# Patient Record
Sex: Female | Born: 1970 | Race: White | Hispanic: No | Smoking: Never smoker
Health system: Southern US, Community
[De-identification: ages and names within clinical notes are randomized; demographics above are authoritative.]

## PROBLEM LIST (undated history)

## (undated) DIAGNOSIS — N390 Urinary tract infection, site not specified: Secondary | ICD-10-CM

## (undated) DIAGNOSIS — L709 Acne, unspecified: Secondary | ICD-10-CM

## (undated) HISTORY — PX: WISDOM TOOTH EXTRACTION: SHX21

## (undated) HISTORY — DX: Urinary tract infection, site not specified: N39.0

## (undated) HISTORY — DX: Acne, unspecified: L70.9

---

## 1998-05-20 ENCOUNTER — Other Ambulatory Visit: Admission: RE | Admit: 1998-05-20 | Discharge: 1998-05-20 | Payer: Self-pay

## 2000-11-30 DIAGNOSIS — Z789 Other specified health status: Secondary | ICD-10-CM

## 2000-11-30 HISTORY — DX: Other specified health status: Z78.9

## 2003-03-12 ENCOUNTER — Encounter: Payer: Self-pay | Admitting: Obstetrics

## 2003-03-12 ENCOUNTER — Ambulatory Visit (HOSPITAL_COMMUNITY): Admission: RE | Admit: 2003-03-12 | Discharge: 2003-03-12 | Payer: Self-pay | Admitting: Obstetrics

## 2003-03-14 ENCOUNTER — Inpatient Hospital Stay (HOSPITAL_COMMUNITY): Admission: AD | Admit: 2003-03-14 | Discharge: 2003-03-14 | Payer: Self-pay | Admitting: Obstetrics

## 2003-03-15 ENCOUNTER — Encounter (INDEPENDENT_AMBULATORY_CARE_PROVIDER_SITE_OTHER): Payer: Self-pay

## 2003-03-17 ENCOUNTER — Inpatient Hospital Stay (HOSPITAL_COMMUNITY): Admission: AD | Admit: 2003-03-17 | Discharge: 2003-03-17 | Payer: Self-pay | Admitting: Obstetrics

## 2003-05-15 ENCOUNTER — Ambulatory Visit (HOSPITAL_COMMUNITY): Admission: RE | Admit: 2003-05-15 | Discharge: 2003-05-15 | Payer: Self-pay | Admitting: Obstetrics & Gynecology

## 2003-05-15 ENCOUNTER — Encounter: Payer: Self-pay | Admitting: Obstetrics & Gynecology

## 2003-05-30 ENCOUNTER — Ambulatory Visit (HOSPITAL_COMMUNITY): Admission: RE | Admit: 2003-05-30 | Discharge: 2003-05-30 | Payer: Self-pay | Admitting: Obstetrics & Gynecology

## 2003-05-30 ENCOUNTER — Encounter: Payer: Self-pay | Admitting: Obstetrics & Gynecology

## 2003-06-10 ENCOUNTER — Inpatient Hospital Stay (HOSPITAL_COMMUNITY): Admission: AD | Admit: 2003-06-10 | Discharge: 2003-06-10 | Payer: Self-pay | Admitting: Obstetrics

## 2003-07-28 ENCOUNTER — Inpatient Hospital Stay (HOSPITAL_COMMUNITY): Admission: AD | Admit: 2003-07-28 | Discharge: 2003-07-28 | Payer: Self-pay | Admitting: Obstetrics

## 2003-07-28 ENCOUNTER — Encounter: Payer: Self-pay | Admitting: Obstetrics

## 2003-08-24 ENCOUNTER — Ambulatory Visit (HOSPITAL_COMMUNITY): Admission: RE | Admit: 2003-08-24 | Discharge: 2003-08-24 | Payer: Self-pay | Admitting: Obstetrics

## 2003-08-24 ENCOUNTER — Encounter: Payer: Self-pay | Admitting: Obstetrics

## 2003-09-23 ENCOUNTER — Inpatient Hospital Stay (HOSPITAL_COMMUNITY): Admission: AD | Admit: 2003-09-23 | Discharge: 2003-09-23 | Payer: Self-pay | Admitting: Obstetrics

## 2003-10-02 ENCOUNTER — Inpatient Hospital Stay (HOSPITAL_COMMUNITY): Admission: AD | Admit: 2003-10-02 | Discharge: 2003-11-21 | Payer: Self-pay | Admitting: Obstetrics

## 2003-12-28 ENCOUNTER — Inpatient Hospital Stay (HOSPITAL_COMMUNITY): Admission: RE | Admit: 2003-12-28 | Discharge: 2003-12-31 | Payer: Self-pay | Admitting: Obstetrics

## 2005-05-04 ENCOUNTER — Ambulatory Visit (HOSPITAL_COMMUNITY): Admission: RE | Admit: 2005-05-04 | Discharge: 2005-05-04 | Payer: Self-pay | Admitting: Obstetrics

## 2005-09-08 IMAGING — US US OB FOLLOW-UP
1 series · 18 of 28 positions shown · non-contrast
Comparison: none

CLINICAL DATA: Assess growth, amniotic fluid volume and cervical length.  G2 P0 SAB1.  LMP 04/10/03.  Preterm labor.

[Series 1: us ob re-eval · 18 of 29 slices shown]
[im 1/29]
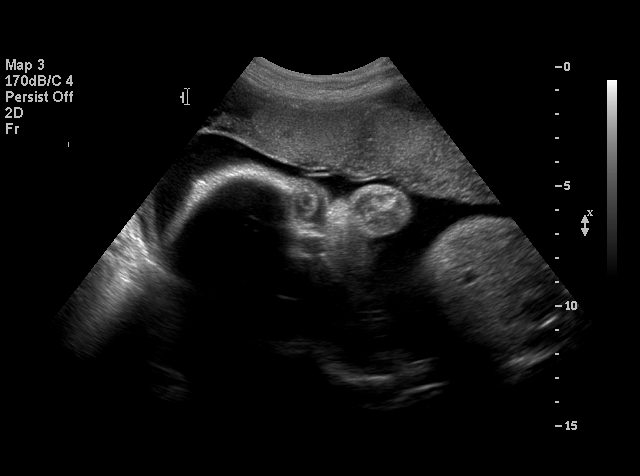
[im 3/29]
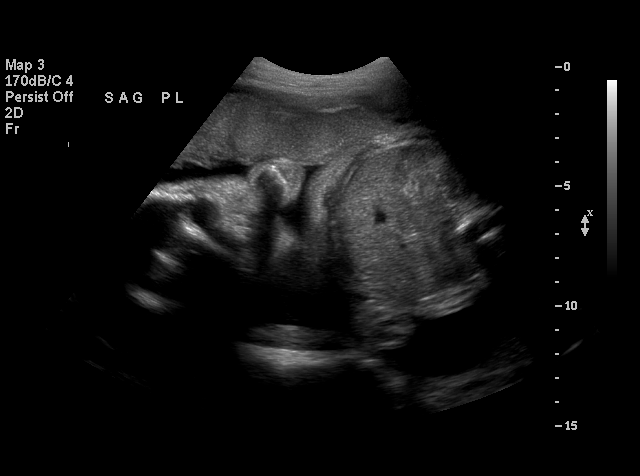
[im 4/29]
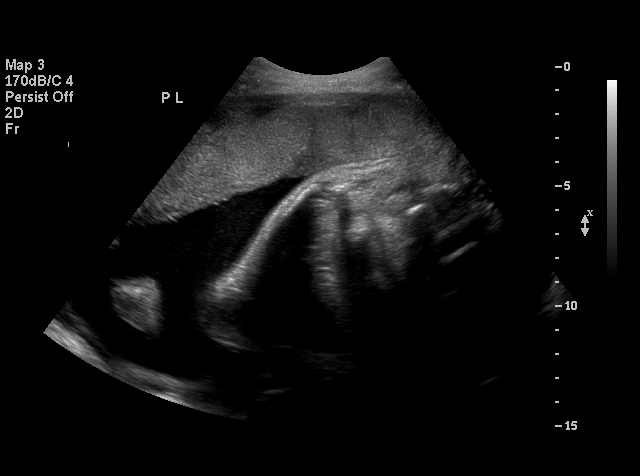
[im 6/29]
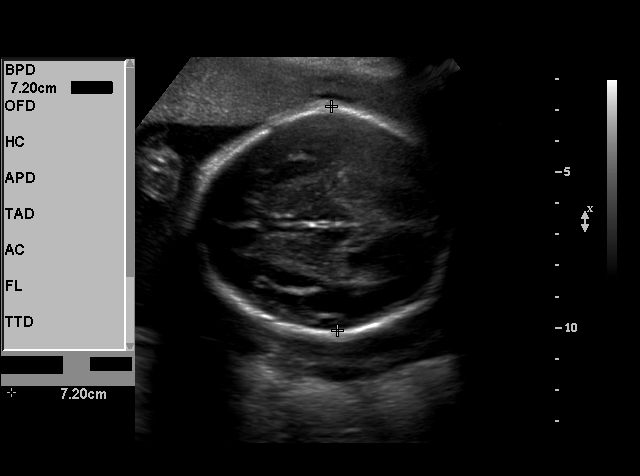
[im 8/29]
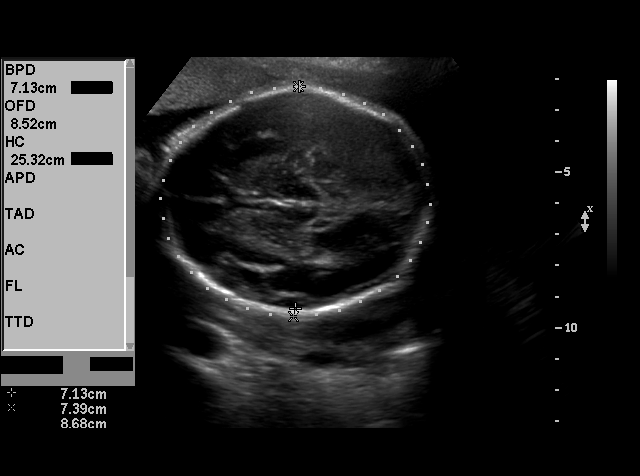
[im 9/29]
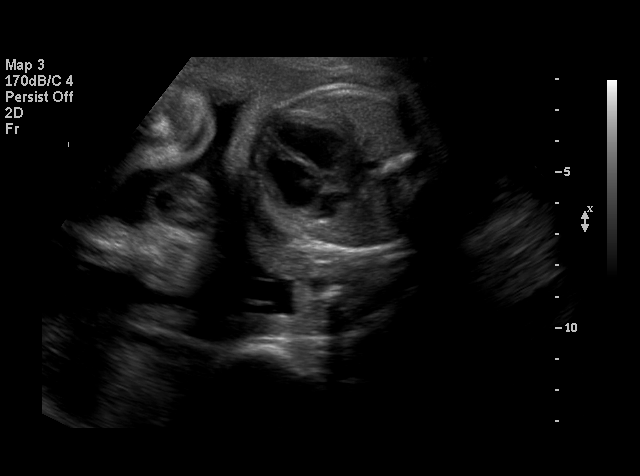
[im 11/29]
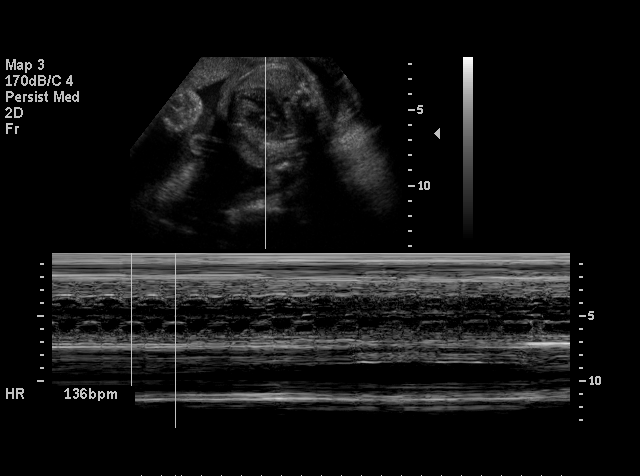
[im 12/29]
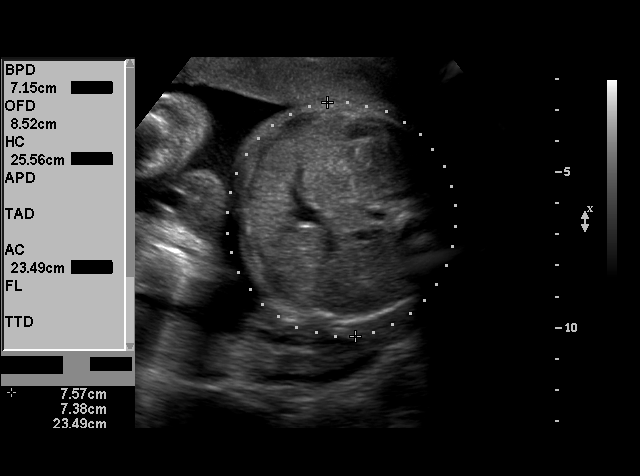
[im 14/29]
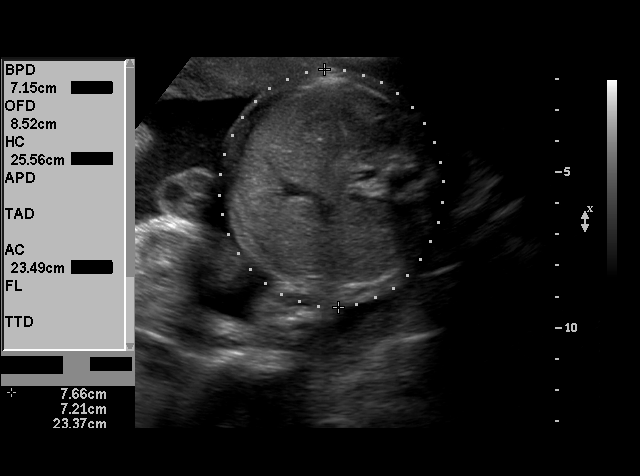
[im 15/29]
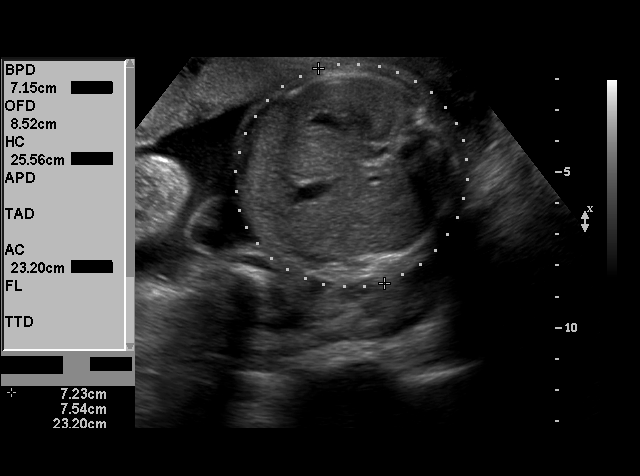
[im 17/29]
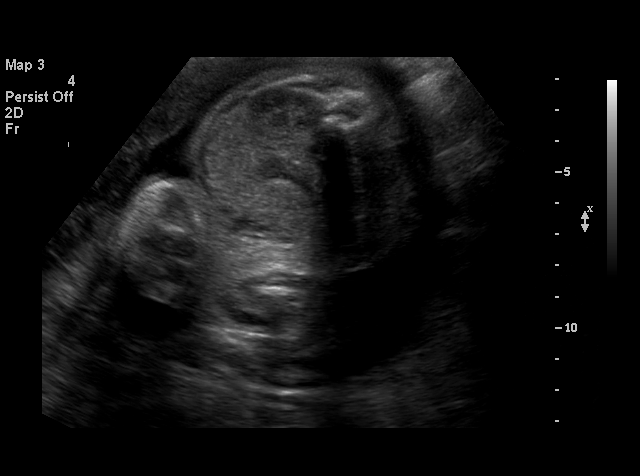
[im 18/29]
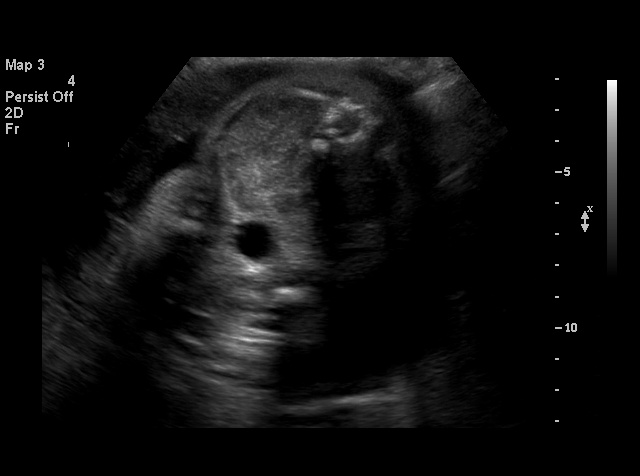
[im 20/29]
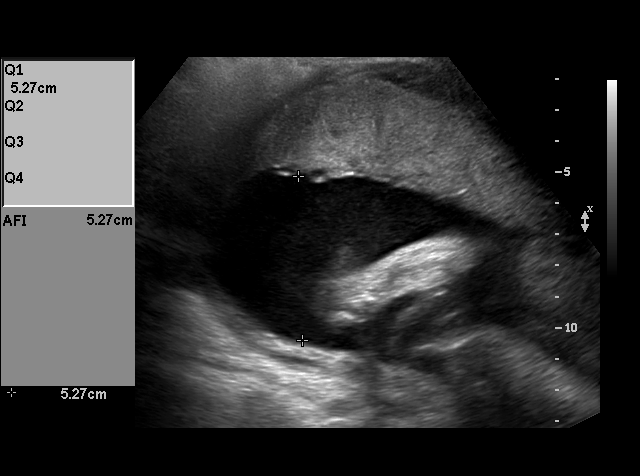
[im 22/29]
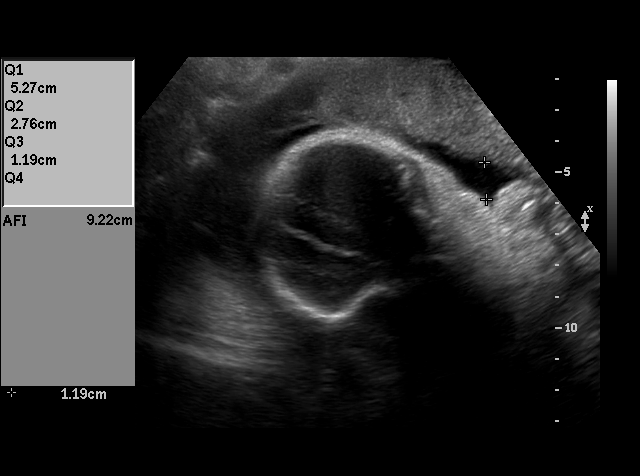
[im 23/29]
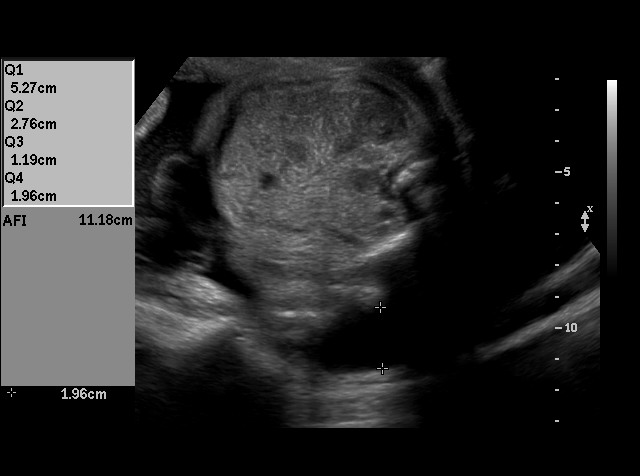
[im 25/29]
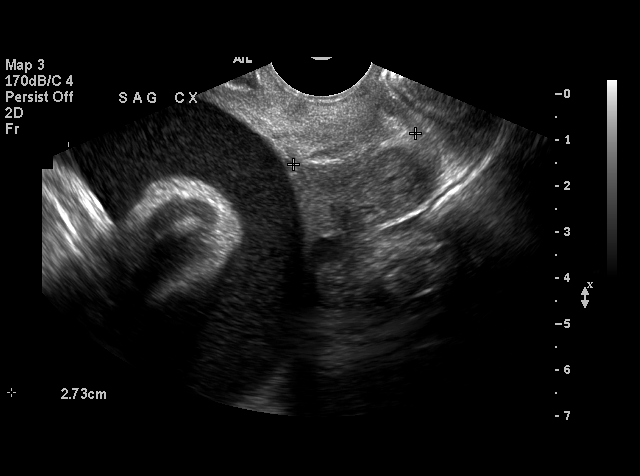
[im 26/29]
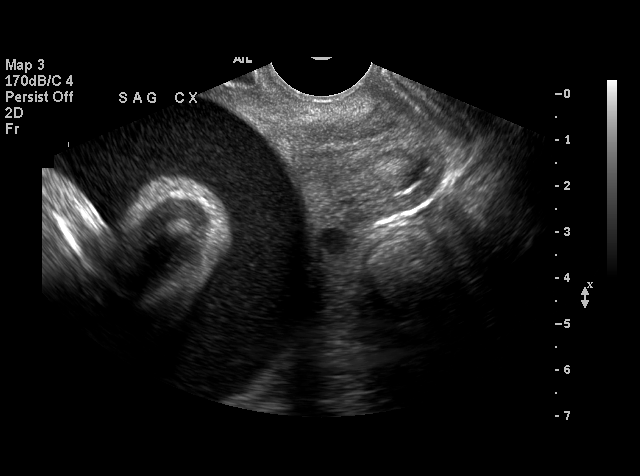
[im 29/29]
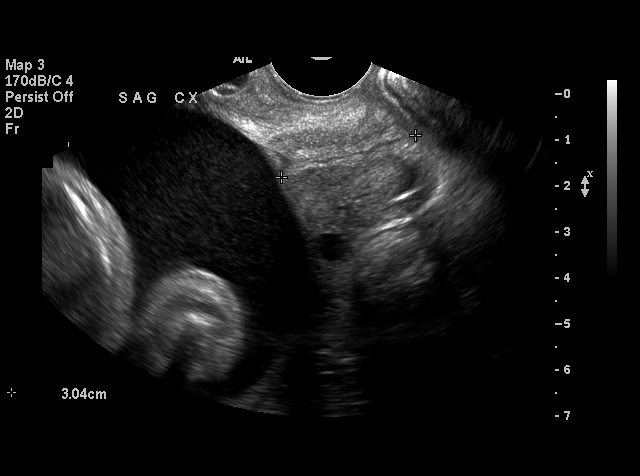

[18 of 28 positions shown; findings below may reference images not displayed]

OBSTETRICAL ULTRASOUND RE-EVALUATION WITH TRANSVAGINAL:

 NUMBER OF FETUSES:  1
 HEART RATE:  136
 MOVEMENT:  Yes
 BREATHING:  No
 PRESENTATION:  Breech
 PLACENTAL LOCATION:  Anterior
 GRADE:  I
 PREVIA:  No
 AMNIOTIC FLUID (subjective):  Normal
 AMNIOTIC FLUID (objective):  11.2 cm AFI (5th – 95th %ile = 9.5 – 22.6 cm for 27 weeks)

 FETAL BIOMETRY
 BPD:  7.2 cm  28 w 6 d
 HC:  25.4 cm  27 w 5 d
 AC:  23.4 cm  27 w 5 d
 FL:  4.7 cm  25 w 4 d
 MEAN GA:  27 w 4 d
 ASSIGNED GA:  27 w 1 d (LMP)
 EFW:  7886 g (H) 50th – 75th (875 – 6760 g) For 27 weeks

 FETAL ANATOMY
 LATERAL VENTRICLES:  Visualized 
 THALAMI/CSP:  Previously seen 
 POSTERIOR FOSSA:  Previously seen 
 NUCHAL REGION:  Previously seen 
 SPINE:  Previously seen 
 4 CHAMBER HEART ON LEFT:  Visualized 
 STOMACH ON LEFT:  Visualized 
 3 VESSEL CORD:  Previously seen 
 CORD INSERTION SITE:  Previously seen 
 KIDNEYS:  Visualized 
 BLADDER:  Visualized 
 EXTREMITIES:  Previously seen 

 MATERNAL FINDINGS
 CERVIX:  3.0 cm Transvaginally
IMPRESSION: Single living intrauterine fetus in breech presentation.  Patient is 27 weeks 1 day by LMP dating and measures 27 weeks 4 days today with a fetal weight of 7886 grams, falling between the 50th and 75th percentile for 27 weeks.  Growth is appropriate.
 Normal amniotic fluid volume with AFI 11.2 cm.  
 Normal cervical length of 3.0 cm on transvaginal scanning.

## 2007-12-18 ENCOUNTER — Emergency Department (HOSPITAL_COMMUNITY): Admission: EM | Admit: 2007-12-18 | Discharge: 2007-12-18 | Payer: Self-pay | Admitting: Emergency Medicine

## 2011-04-17 NOTE — Discharge Summary (Signed)
NAME:  LASSIE, DEMOREST               ACCOUNT NO.:  [id number]   MEDICAL RECORD NO.:  [id number]                   PATIENT TYPE:  INP   LOCATION:  9154                                 FACILITY:  [facility]   PHYSICIAN:  Charles A. Clearance Coots, M.D.             DATE OF BIRTH:  [birth year]   DATE OF ADMISSION:  10/02/2003  DATE OF DISCHARGE:  11/21/2003                                 DISCHARGE SUMMARY   ADMITTING DIAGNOSES:  1. A 25-week gestation.  2. Preterm cervical changes.   DISCHARGE DIAGNOSES:  1. A 25-week gestation.  2. Preterm cervical changes.  3. Status post bed rest and intravenous tocolysis with magnesium sulfate.  4. Discharged home undelivered at [redacted] weeks gestation in good condition.   REASON FOR ADMISSION:  A 40 year old white female G2, P0, estimated date of  confinement January 14, 2003, history of preterm cervical changes.  Had  been followed in the office at the [facility] weekly with  cervical examinations.  On the day of admission the patient had complained  of increased lower abdominal cramping and pressure for the past 24 hours  prior to admission.  On examination the cervix was soft, well-developed  lower uterine segment and cervix was closed and about 50% effaced and the  vertex at about a -1 to 0 station.  A decision was made to admit for bed  rest and tocolysis as needed.   PAST SURGICAL HISTORY:  1. Wisdom teeth.  2. D&C.   PAST MEDICAL HISTORY:  None.   MEDICATIONS:  1. Prenatal vitamins.  2. Delalutin for preterm uterine activity.  3. Zantac for gastroesophageal reflux.   ALLERGIES:  No known drug allergies.   SOCIAL HISTORY:  Married.  Teacher.  Negative tobacco, alcohol, or  recreational drug use.   FAMILY HISTORY:  Noncontributory.   PHYSICAL EXAMINATION:  GENERAL:  Slim white female in no acute distress.  VITAL SIGNS:  Afebrile.  Vital signs are stable.  HEENT:  Normal.  NECK:  Supple.  No adenopathy.  LUNGS:  Clear to  auscultation bilaterally.  HEART:  Regular rate and rhythm.  ABDOMEN:  Gravid, soft, nontender.  PELVIC:  Per history of present illness.  Cervix was closed, 50% effaced,  soft, and well-developed lower uterine segment.  EXTREMITIES:  Without clubbing, cyanosis, edema.   IMPRESSION:  1. A 25-week gestation.  2. Preterm cervical changes.   PLAN:  Admit.  Bed rest.  IV tocolysis.   LABORATORIES:  Urinalysis was within normal limits.  Hemoglobin 12.0,  hematocrit 34.5, white blood cell count 8700, platelets 247,000.  RPR was  nonreactive.   HOSPITAL COURSE:  The patient was admitted and had increased uterine  activity during her initial admission and was started on IV magnesium  sulfate tocolysis.  She responded well to IV magnesium sulfate and was  continued on the regimen of bed rest and IV magnesium sulfate tocolysis.  An  attempt was made to discontinue the magnesium sulfate at  about [redacted] weeks  gestation but the patient had increased discomfort and increased uterine  activity and she was placed back on IV magnesium sulfate.  A decision was  made to continue IV magnesium sulfate until [redacted] weeks gestation.  The patient  continued to do well on bed rest and IV tocolysis.  Ultrasound was obtained  at approximately [redacted] weeks gestation and revealed good interval fetal growth  of approximately between 50-75th percentile and estimated fetal weight of  1757 g.  Amniotic fluid index was 11.7 cm.  Cervical length was 3.0 cm.  Fetal fibronectin was done on the day of discharge.  The patient was  therefore discharged home at 32-1/[redacted] weeks gestation, undelivered, in good  condition.   DISCHARGE LABORATORIES:  Fetal fibronectin pending results.   DISCHARGE DISPOSITION:   MEDICATIONS:  Continue prenatal vitamins.   DISCHARGE INSTRUCTIONS:  Routine written instructions were given for  obstetrical discharge, undelivered at [redacted] weeks gestation.  The patient is to  continue similar regimen of bed  rest in a modified fashion at home as  instructed and she will follow up at the office at the [facility]  in two weeks.                                               Charles A. Clearance Coots, M.D.    CAH/MEDQ  D:  11/21/2003  T:  11/21/2003  Job:  [id number]   cc:   Ernestina Penna, M.D.  [address] [place]  [place] [address]  Fax: [phone]

## 2011-04-17 NOTE — Discharge Summary (Signed)
NAME:  ZOII, FLORER               ACCOUNT NO.:  [id number]   MEDICAL RECORD NO.:  [id number]                   PATIENT TYPE:  INP   LOCATION:  9126                                 FACILITY:  [facility]   PHYSICIAN:  Charles A. Clearance Coots, M.D.             DATE OF BIRTH:  11-05-1971   DATE OF ADMISSION:  12/28/2003  DATE OF DISCHARGE:  12/31/2003                                 DISCHARGE SUMMARY   ADMISSION DIAGNOSES:  1. Gestation at 37 weeks.  2. Spontaneous rupture of membranes.   DISCHARGE DIAGNOSES:  1. Gestation at 37 weeks.  2. Spontaneous rupture of membranes.  3. Status post induction of labor and normal spontaneous vaginal delivery of     a viable female infant on December 29, 2003, at 40.  Apgar's of 8 at one     minute and 9 at five minutes, weight of 2740 g, length of  47 cm.   CONDITION ON DISCHARGE:  Mother and infant discharged home in good  condition.   REASON FOR ADMISSION:  A 40 year old white female, G2, P0, at [redacted] weeks  gestation with a history of leaking clear fluid and ultrasound revealing an  amniotic fluid index of 6.  The patient was therefore admitted for induction  of labor for spontaneous rupture of membranes at [redacted] weeks gestation.   PAST SURGICAL HISTORY:  None.   PAST MEDICAL HISTORY:  None.   MEDICATIONS:  Prenatal vitamins.   ALLERGIES:  No known drug allergies.   SOCIAL HISTORY:  Married.  Negative for tobacco, alcohol, or recreational  drug use.   PHYSICAL EXAMINATION:  GENERAL:  A well-developed, well-nourished white  female in no acute distress.  VITAL SIGNS:  She is afebrile, vital signs are stable.  ABDOMEN:  Soft, nontender.  PELVIC:  Sterile speculum examination revealed no pooling and __________was  negative.   LABORATORY DATA:  Formal ultrasound revealed an amniotic fluid index of 6  cm, consistent with oligohydramnios.  Biophysical profile was 8/8.  Hemoglobin 11.9, hematocrit 33.7, white blood cell count 9500,  platelets  201,000.  Group B Strep culture was negative.   HOSPITAL COURSE:  The patient was started on Pitocin induction of labor and  made no progress on the first day of admission, and was therefore rested  overnight and Pitocin was resumed early morning on December 29, 2003.  The  patient progressed quite well and delivered a viable female on December 29, 2003, at 40.  There were no intrapartum complications.  Postpartum course  was uncomplicated, and the patient was discharged home on postpartum day #2  in good condition.   DISCHARGE MEDICATIONS:  Continue prenatal vitamins.   Routine written instructions were given for obstetrical discharge.   FOLLOWUP:  The patient was to follow up in our office in six weeks.  Charles A. Clearance Coots, M.D.    CAH/MEDQ  D:  12/31/2003  T:  12/31/2003  Job:  [id number]

## 2012-03-16 ENCOUNTER — Ambulatory Visit (INDEPENDENT_AMBULATORY_CARE_PROVIDER_SITE_OTHER): Payer: No Typology Code available for payment source | Admitting: Physician Assistant

## 2012-03-16 VITALS — BP 129/78 | HR 64 | Temp 98.3°F | Resp 16 | Ht 63.5 in | Wt 129.0 lb

## 2012-03-16 DIAGNOSIS — T300 Burn of unspecified body region, unspecified degree: Secondary | ICD-10-CM

## 2012-03-16 NOTE — Progress Notes (Signed)
  Subjective:    Patient ID: Theresa Fitzgerald, female    DOB: [birth year], 41 y.o.   MRN: [id number]  HPI Patient presents today with a cc of "contrcting pain" associated with a burn on her left foot.  Patient's ankle got caught in treadmill last wed. 4/10. Saturday, 4/13 she sought care at another facility for infection of the wound.  Patient was told to double her minocycline (which she uses for treatment of acne), clean with soap and water, and cover with bandage and neosporin.  Since Saturday the patient has been experiencing sharp pain under the burn which has a crescendo-decrescendo quality. At it's peak she rates it at a 10/10 and says that it "takes her breath." Pain seems to be worsened after many hours of standing. Nothing seems to provoke these episodes.  Patient took ibuprofen which helped but is reluctant to continue NSAIDS due to HX bleeding ulcer. She denies any constitutional sxs, current heat, redness or discharge from wound.  She denies ankle sprain or other musculoskeletal injury to the ankle.  Review of Systems   As stated int the HPI Objective:   Physical Exam Patient has a well healing second degree burn on the anteromedial portion of the right ankle just anterior to the medial malleolus. Wound is pink and clean. There is not discharge,erythema, or other signs of infection. Mild swelling is present.  Patient has full ROM of the ankle.        Assessment & Plan:   1. Burn   Patient was instructed to continue with proper wound care and that this symptom will likely resolve overtime without medication.  She was asked to return should symptoms worsen in frequency, severity or duration.

## 2012-03-16 NOTE — Progress Notes (Signed)
Examined with student and agree. csj 

## 2012-11-08 ENCOUNTER — Ambulatory Visit: Payer: No Typology Code available for payment source | Admitting: Family Medicine

## 2013-12-04 ENCOUNTER — Ambulatory Visit: Payer: No Typology Code available for payment source | Admitting: Family Medicine

## 2013-12-08 ENCOUNTER — Other Ambulatory Visit: Payer: Self-pay | Admitting: *Deleted

## 2013-12-08 DIAGNOSIS — IMO0001 Reserved for inherently not codable concepts without codable children: Secondary | ICD-10-CM

## 2013-12-08 MED ORDER — NORGESTIMATE-ETH ESTRADIOL 0.25-35 MG-MCG PO TABS: 1.0000 | ORAL_TABLET | Freq: Every day | ORAL | Status: DC

## 2013-12-19 ENCOUNTER — Encounter: Payer: Self-pay | Admitting: Obstetrics

## 2013-12-19 ENCOUNTER — Ambulatory Visit (INDEPENDENT_AMBULATORY_CARE_PROVIDER_SITE_OTHER): Payer: BC Managed Care – PPO | Admitting: Obstetrics

## 2013-12-19 VITALS — BP 116/71 | HR 73 | Temp 98.3°F | Ht 64.0 in | Wt 114.0 lb

## 2013-12-19 DIAGNOSIS — Z01419 Encounter for gynecological examination (general) (routine) without abnormal findings: Secondary | ICD-10-CM

## 2013-12-19 DIAGNOSIS — Z113 Encounter for screening for infections with a predominantly sexual mode of transmission: Secondary | ICD-10-CM

## 2013-12-19 DIAGNOSIS — Z3041 Encounter for surveillance of contraceptive pills: Secondary | ICD-10-CM

## 2013-12-19 MED ORDER — NORGESTIMATE-ETH ESTRADIOL 0.25-35 MG-MCG PO TABS: 1.0000 | ORAL_TABLET | Freq: Every day | ORAL | Status: DC

## 2013-12-19 NOTE — Progress Notes (Signed)
Subjective:     Theresa Fitzgerald is a 43 y.o. female here for a routine exam.  Current complaints: Pt has no complaint today.  Personal health questionnaire reviewed: yes.   Gynecologic History Patient's last menstrual period was 11/19/2013. Contraception: OCP (estrogen/progesterone) Last Pap: 12/2012. Results were: normal Last mammogram: n/a  Obstetric History OB History  No data available     The following portions of the patient's history were reviewed and updated as appropriate: allergies, current medications, past family history, past medical history, past social history, past surgical history and problem list.  Review of Systems Pertinent items are noted in HPI.    Objective:    General appearance: alert and no distress Breasts: normal appearance, no masses or tenderness Abdomen: normal findings: soft, non-tender Pelvic: cervix normal in appearance, external genitalia normal, no adnexal masses or tenderness, no cervical motion tenderness, rectovaginal septum normal, uterus normal size, shape, and consistency and vagina normal without discharge    Assessment:    Healthy female exam.    Plan:    Education reviewed: calcium supplements and self breast exams. Contraception: OCP (estrogen/progesterone). Follow up in: 1 year.

## 2013-12-20 LAB — WET PREP BY MOLECULAR PROBE
Candida species: NEGATIVE
Gardnerella vaginalis: NEGATIVE
Trichomonas vaginosis: NEGATIVE

## 2013-12-20 LAB — GC/CHLAMYDIA PROBE AMP
CT Probe RNA: NEGATIVE
GC Probe RNA: NEGATIVE

## 2013-12-21 LAB — PAP IG W/ RFLX HPV ASCU

## 2013-12-22 LAB — HUMAN PAPILLOMAVIRUS, HIGH RISK: HPV DNA High Risk: DETECTED — AB

## 2014-01-01 ENCOUNTER — Ambulatory Visit: Payer: No Typology Code available for payment source | Admitting: Obstetrics

## 2014-12-13 ENCOUNTER — Other Ambulatory Visit: Payer: Self-pay | Admitting: *Deleted

## 2014-12-13 DIAGNOSIS — Z3041 Encounter for surveillance of contraceptive pills: Secondary | ICD-10-CM

## 2014-12-13 MED ORDER — NORGESTIMATE-ETH ESTRADIOL 0.25-35 MG-MCG PO TABS: 1.0000 | ORAL_TABLET | Freq: Every day | ORAL | Status: DC

## 2015-11-20 ENCOUNTER — Other Ambulatory Visit: Payer: Self-pay | Admitting: Obstetrics

## 2015-11-27 ENCOUNTER — Ambulatory Visit (INDEPENDENT_AMBULATORY_CARE_PROVIDER_SITE_OTHER): Payer: BC Managed Care – PPO | Admitting: Obstetrics

## 2015-11-27 ENCOUNTER — Encounter: Payer: Self-pay | Admitting: Obstetrics

## 2015-11-27 VITALS — BP 135/88 | HR 84 | Temp 98.3°F | Wt 110.0 lb

## 2015-11-27 DIAGNOSIS — Z01419 Encounter for gynecological examination (general) (routine) without abnormal findings: Secondary | ICD-10-CM

## 2015-11-27 DIAGNOSIS — N390 Urinary tract infection, site not specified: Secondary | ICD-10-CM

## 2015-11-27 DIAGNOSIS — Z3041 Encounter for surveillance of contraceptive pills: Secondary | ICD-10-CM

## 2015-11-27 LAB — POCT URINALYSIS DIPSTICK
Bilirubin, UA: NEGATIVE
GLUCOSE UA: NEGATIVE
Ketones, UA: NEGATIVE
NITRITE UA: NEGATIVE
PROTEIN UA: NEGATIVE
RBC UA: NEGATIVE
Spec Grav, UA: 1.01
UROBILINOGEN UA: NEGATIVE
pH, UA: 8

## 2015-11-27 MED ORDER — NITROFURANTOIN MONOHYD MACRO 100 MG PO CAPS: 100.0000 mg | ORAL_CAPSULE | Freq: Two times a day (BID) | ORAL | Status: DC

## 2015-11-27 NOTE — Progress Notes (Signed)
Subjective:        Theresa Fitzgerald is a 44 y.o. female here for a routine exam.  Current complaints: Burning with urination and increased vaginal discharge..    Personal health questionnaire:  Is patient Ashkenazi Jewish, have a family history of breast and/or ovarian cancer: no Is there a family history of uterine cancer diagnosed at age < 52, gastrointestinal cancer, urinary tract cancer, family member who is a Personnel officer syndrome-associated carrier: no Is the patient overweight and hypertensive, family history of diabetes, personal history of gestational diabetes, preeclampsia or PCOS: no Is patient over 1, have PCOS,  family history of premature CHD under age 19, diabetes, smoke, have hypertension or peripheral artery disease:  no At any time, has a partner hit, kicked or otherwise hurt or frightened you?: no Over the past 2 weeks, have you felt down, depressed or hopeless?: no Over the past 2 weeks, have you felt little interest or pleasure in doing things?:no   Gynecologic History Patient's last menstrual period was 11/17/2015. Contraception: OCP (estrogen/progesterone) Last Pap: 2014. Results were: normal Last mammogram: none. Results were: none  Obstetric History OB History  No data available    History reviewed. No pertinent past medical history.  History reviewed. No pertinent past surgical history.   Current outpatient prescriptions:  .  SPRINTEC 28 0.25-35 MG-MCG tablet, TAKE 1 TABLET BY MOUTH DAILY., Disp: 28 tablet, Rfl: 11 .  tretinoin (RETIN-A) 0.025 % cream, Apply topically at bedtime., Disp: , Rfl:  .  nitrofurantoin, macrocrystal-monohydrate, (MACROBID) 100 MG capsule, Take 1 capsule (100 mg total) by mouth 2 (two) times daily. 1 po BID x 7days, Disp: 14 capsule, Rfl: 2 No Known Allergies  Social History  Substance Use Topics  . Smoking status: Never Smoker   . Smokeless tobacco: Not on file  . Alcohol Use: No    History reviewed. No pertinent  family history.    Review of Systems  Constitutional: negative for fatigue and weight loss Respiratory: negative for cough and wheezing Cardiovascular: negative for chest pain, fatigue and palpitations Gastrointestinal: negative for abdominal pain and change in bowel habits Musculoskeletal:negative for myalgias Neurological: negative for gait problems and tremors Behavioral/Psych: negative for abusive relationship, depression Endocrine: negative for temperature intolerance   Genitourinary:negative for abnormal menstrual periods, genital lesions, hot flashes, sexual problems.  Positive for burning with urination and vaginal discharge Integument/breast: negative for breast lump, breast tenderness, nipple discharge and skin lesion(s)    Objective:       BP 135/88 mmHg  Pulse 84  Temp(Src) 98.3 F (36.8 C)  Wt 110 lb (49.896 kg)  LMP 11/17/2015 General:   alert  Skin:   no rash or abnormalities  Lungs:   clear to auscultation bilaterally  Heart:   regular rate and rhythm, S1, S2 normal, no murmur, click, rub or gallop  Breasts:   normal without suspicious masses, skin or nipple changes or axillary nodes  Abdomen:  normal findings: no organomegaly, soft, non-tender and no hernia  Pelvis:  External genitalia: normal general appearance Urinary system: urethral meatus normal and bladder without fullness, nontender Vaginal: normal without tenderness, induration or masses.  Grey, creamy discharge. Cervix: normal appearance Adnexa: normal bimanual exam Uterus: anteverted and non-tender, normal size   Lab Review Urine pregnancy test Labs reviewed yes Radiologic studies reviewed no    Assessment:    Healthy female exam.    UTI symptoms  Contraceptive Management.  Pleased with OCP's.   Plan:  Macrobid Rx  Education reviewed: calcium supplements, safe sex/STD prevention, self breast exams and weight bearing exercise. Contraception: OCP (estrogen/progesterone). Mammogram  ordered. Follow up in: 1 year.   Meds ordered this encounter  Medications  . tretinoin (RETIN-A) 0.025 % cream    Sig: Apply topically at bedtime.  . nitrofurantoin, macrocrystal-monohydrate, (MACROBID) 100 MG capsule    Sig: Take 1 capsule (100 mg total) by mouth 2 (two) times daily. 1 po BID x 7days    Dispense:  14 capsule    Refill:  2   Orders Placed This Encounter  Procedures  . Urine culture  . SureSwab, Vaginosis/Vaginitis Plus

## 2015-11-27 NOTE — Addendum Note (Signed)
Addended by: Henriette CombsHATTON, ANDREA L on: 11/27/2015 04:44 PM   Modules accepted: Orders

## 2015-11-29 LAB — URINE CULTURE: Colony Count: 80000

## 2015-11-30 ENCOUNTER — Other Ambulatory Visit: Payer: Self-pay | Admitting: Obstetrics

## 2015-11-30 DIAGNOSIS — N390 Urinary tract infection, site not specified: Secondary | ICD-10-CM

## 2015-11-30 LAB — SURESWAB, VAGINOSIS/VAGINITIS PLUS
Atopobium vaginae: NOT DETECTED Log (cells/mL)
C. GLABRATA, DNA: NOT DETECTED
C. TROPICALIS, DNA: NOT DETECTED
C. albicans, DNA: NOT DETECTED
C. parapsilosis, DNA: NOT DETECTED
C. trachomatis RNA, TMA: NOT DETECTED
Gardnerella vaginalis: NOT DETECTED Log (cells/mL)
LACTOBACILLUS SPECIES: NOT DETECTED Log (cells/mL)
MEGASPHAERA SPECIES: NOT DETECTED Log (cells/mL)
N. GONORRHOEAE RNA, TMA: NOT DETECTED
T. vaginalis RNA, QL TMA: NOT DETECTED

## 2015-11-30 MED ORDER — AMOXICILLIN-POT CLAVULANATE 875-125 MG PO TABS: 1.0000 | ORAL_TABLET | Freq: Two times a day (BID) | ORAL | Status: DC

## 2015-12-10 ENCOUNTER — Other Ambulatory Visit: Payer: Self-pay | Admitting: Obstetrics

## 2015-12-10 DIAGNOSIS — Z1239 Encounter for other screening for malignant neoplasm of breast: Secondary | ICD-10-CM

## 2016-01-31 ENCOUNTER — Ambulatory Visit (INDEPENDENT_AMBULATORY_CARE_PROVIDER_SITE_OTHER): Payer: BC Managed Care – PPO | Admitting: Certified Nurse Midwife

## 2016-01-31 ENCOUNTER — Encounter: Payer: Self-pay | Admitting: Certified Nurse Midwife

## 2016-01-31 VITALS — BP 117/78 | HR 69 | Wt 111.0 lb

## 2016-01-31 DIAGNOSIS — N939 Abnormal uterine and vaginal bleeding, unspecified: Secondary | ICD-10-CM | POA: Insufficient documentation

## 2016-01-31 DIAGNOSIS — Z3041 Encounter for surveillance of contraceptive pills: Secondary | ICD-10-CM | POA: Diagnosis not present

## 2016-01-31 DIAGNOSIS — N923 Ovulation bleeding: Secondary | ICD-10-CM

## 2016-01-31 DIAGNOSIS — N946 Dysmenorrhea, unspecified: Secondary | ICD-10-CM | POA: Diagnosis not present

## 2016-01-31 HISTORY — DX: Dysmenorrhea, unspecified: N94.6

## 2016-01-31 MED ORDER — LEVONORGESTREL-ETHINYL ESTRAD 90-20 MCG PO TABS: 1.0000 | ORAL_TABLET | Freq: Every day | ORAL | Status: DC

## 2016-01-31 NOTE — Progress Notes (Signed)
Patient ID: Theresa Fitzgerald, female   DOB: [birth year], 45 y.o.   MRN: [id number]  Chief Complaint  Patient presents with  . Menorrhagia    spotting/discharge after intercouse    HPI Theresa Fitzgerald is a 45 y.o. female.  Here for AUB after sexual intercourse.  This is a new problem.  She has a new sexual partner.  After sexual intercourse has pink vaginal discharge.  The last few sexual intercourse times has had more red bleeding on sheets.  Wants to make sure that it is not an STI.  Discussed trying various sexual positions during sexual intercourse.  She stated understanding and tying to use lubrication.  Is taking Sprintec but starting to have longer periods, more acne and dysmenorrhea.  Has been on sprintec for years.  Takes at the same time each day and uses alarm to remind herself.  Denies any vaginal dryness or hot flashes.  States her mom was in her early 2650's when she went through menopause.  Occasionally has night sweats.  Currently her period is lasting 5 days with clots, heavy bleeding and dysmenorrhea.      HPI  No past medical history on file.  No past surgical history on file.  No family history on file.  Social History Social History  Substance Use Topics  . Smoking status: Never Smoker   . Smokeless tobacco: Not on file  . Alcohol Use: No    No Known Allergies  Current Outpatient Prescriptions  Medication Sig Dispense Refill  . SPRINTEC 28 0.25-35 MG-MCG tablet TAKE 1 TABLET BY MOUTH DAILY. 28 tablet 11  . levonorgestrel-ethinyl estradiol (LYBREL,AMETHYST) 90-20 MCG tablet Take 1 tablet by mouth daily. 1 Package 11  . tretinoin (RETIN-A) 0.025 % cream Apply topically at bedtime. Reported on 01/31/2016     No current facility-administered medications for this visit.    Review of Systems Review of Systems Constitutional: negative for fatigue and weight loss Respiratory: negative for cough and wheezing Cardiovascular: negative for chest pain,  fatigue and palpitations Gastrointestinal: negative for abdominal pain and change in bowel habits Genitourinary:negative Integument/breast: negative for nipple discharge Musculoskeletal:negative for myalgias Neurological: negative for gait problems and tremors Behavioral/Psych: negative for abusive relationship, depression Endocrine: negative for temperature intolerance     Blood pressure 117/78, pulse 69, weight 111 lb (50.349 kg), last menstrual period 01/17/2016.  Physical Exam Physical Exam General:   alert  Skin:   no rash or abnormalities  Lungs:   clear to auscultation bilaterally  Heart:   regular rate and rhythm, S1, S2 normal, no murmur, click, rub or gallop  Breasts:   deferred  Abdomen:  normal findings: no organomegaly, soft, non-tender and no hernia  Pelvis:  External genitalia: normal general appearance Urinary system: urethral meatus normal and bladder without fullness, nontender Vaginal: normal without tenderness, induration or masses Cervix: no CMT Adnexa: normal bimanual exam Uterus: anteverted and non-tender, normal size    50% of 15 min visit spent on counseling and coordination of care.   Data Reviewed Previous medical hx, meds  Assessment     Vaginal spotting of unknown origin ? Partner endowment Dysmenorrhea AUB with OCPs Contraception management      Plan    Orders Placed This Encounter  Procedures  . SureSwab, Vaginosis/Vaginitis Plus   Meds ordered this encounter  Medications  . levonorgestrel-ethinyl estradiol (LYBREL,AMETHYST) 90-20 MCG tablet    Sig: Take 1 tablet by mouth daily.    Dispense:  1 Package  Refill:  11     Possible management options include: LoLo, blood hormone level studies, pelvic US Follow up as needed.

## 2016-02-05 LAB — SURESWAB, VAGINOSIS/VAGINITIS PLUS
Atopobium vaginae: NOT DETECTED Log (cells/mL)
BV CATEGORY: UNDETERMINED — AB
C. ALBICANS, DNA: NOT DETECTED
C. GLABRATA, DNA: NOT DETECTED
C. PARAPSILOSIS, DNA: NOT DETECTED
C. TRACHOMATIS RNA, TMA: NOT DETECTED
C. tropicalis, DNA: NOT DETECTED
Gardnerella vaginalis: 6.8 Log (cells/mL)
LACTOBACILLUS SPECIES: 6.8 Log (cells/mL)
MEGASPHAERA SPECIES: NOT DETECTED Log (cells/mL)
N. GONORRHOEAE RNA, TMA: NOT DETECTED
T. VAGINALIS RNA, QL TMA: NOT DETECTED

## 2016-02-06 ENCOUNTER — Other Ambulatory Visit: Payer: Self-pay | Admitting: Certified Nurse Midwife

## 2016-02-06 MED ORDER — METRONIDAZOLE 500 MG PO TABS: 500.0000 mg | ORAL_TABLET | Freq: Two times a day (BID) | ORAL | Status: DC

## 2016-02-11 ENCOUNTER — Encounter: Payer: Self-pay | Admitting: *Deleted

## 2016-03-05 ENCOUNTER — Encounter (INDEPENDENT_AMBULATORY_CARE_PROVIDER_SITE_OTHER): Payer: BC Managed Care – PPO | Admitting: Ophthalmology

## 2016-03-05 DIAGNOSIS — H43813 Vitreous degeneration, bilateral: Secondary | ICD-10-CM | POA: Diagnosis not present

## 2016-03-05 DIAGNOSIS — H353111 Nonexudative age-related macular degeneration, right eye, early dry stage: Secondary | ICD-10-CM | POA: Diagnosis not present

## 2016-11-01 ENCOUNTER — Other Ambulatory Visit: Payer: Self-pay | Admitting: Obstetrics

## 2017-09-15 ENCOUNTER — Encounter: Payer: Self-pay | Admitting: Obstetrics

## 2017-09-15 ENCOUNTER — Ambulatory Visit (INDEPENDENT_AMBULATORY_CARE_PROVIDER_SITE_OTHER): Payer: BC Managed Care – PPO | Admitting: Obstetrics

## 2017-09-15 DIAGNOSIS — N939 Abnormal uterine and vaginal bleeding, unspecified: Secondary | ICD-10-CM | POA: Diagnosis not present

## 2017-09-15 DIAGNOSIS — Z113 Encounter for screening for infections with a predominantly sexual mode of transmission: Secondary | ICD-10-CM

## 2017-09-15 DIAGNOSIS — N9089 Other specified noninflammatory disorders of vulva and perineum: Secondary | ICD-10-CM | POA: Diagnosis not present

## 2017-09-15 DIAGNOSIS — Z1239 Encounter for other screening for malignant neoplasm of breast: Secondary | ICD-10-CM

## 2017-09-15 DIAGNOSIS — Z7689 Persons encountering health services in other specified circumstances: Secondary | ICD-10-CM

## 2017-09-15 NOTE — Progress Notes (Signed)
Patient ID: Theresa Fitzgerald, female   DOB: 07/10/1971, 46 y.o.   MRN: [id number]  Chief Complaint  Patient presents with  . Vaginal Itching    irritation x 1 week  . Blisters    in vaginal area  . Dyspareunia    during and after    HPI Theresa Fitzgerald is a 46 y.o. female.  Labial irritation and ? " blisters"and pain in area of blisters for the last 2 days.  Pain during and cramping and bleeding after intercourse. HPI  History reviewed. No pertinent past medical history.  History reviewed. No pertinent surgical history.  Family History  Problem Relation Age of Onset  . Hypertension Father   . Hyperlipidemia Father   . Hypertension Mother   . Hyperlipidemia Mother     Social History Social History  Substance Use Topics  . Smoking status: Never Smoker  . Smokeless tobacco: Never Used  . Alcohol use No    No Known Allergies  Current Outpatient Prescriptions  Medication Sig Dispense Refill  . spironolactone (ALDACTONE) 50 MG tablet Take 1 tablet by mouth daily.    . SPRINTEC 28 0.25-35 MG-MCG tablet TAKE 1 TABLET BY MOUTH DAILY. 28 tablet 11   No current facility-administered medications for this visit.     Review of Systems Review of Systems Constitutional: negative for fatigue and weight loss Respiratory: negative for cough and wheezing Cardiovascular: negative for chest pain, fatigue and palpitations Gastrointestinal: negative for abdominal pain and change in bowel habits Genitourinary:positive for labial irritation and ? " blisters ", and pain and bleeding during and after intercourse   Integument/breast: negative for nipple discharge Musculoskeletal:negative for myalgias Neurological: negative for gait problems and tremors Behavioral/Psych: negative for abusive relationship, depression Endocrine: negative for temperature intolerance      There were no vitals taken for this visit.  Physical Exam Physical Exam General:   alert  Skin:    no rash or abnormalities  Lungs:   clear to auscultation bilaterally  Heart:   regular rate and rhythm, S1, S2 normal, no murmur, click, rub or gallop  Breasts:   normal without suspicious masses, skin or nipple changes or axillary nodes  Abdomen:  normal findings: no organomegaly, soft, non-tender and no hernia  Pelvis:  External genitalia: right labial excoriation, tender.  Herpes culture done. Urinary system: urethral meatus normal and bladder without fullness, nontender Vaginal: normal without tenderness, induration or masses Cervix: normal appearance Adnexa: normal bimanual exam Uterus: anteverted and non-tender, normal size    50% of 15 min visit spent on counseling and coordination of care.    Data Reviewed Labs  Assessment     1. Encounter for assessment of sexually transmitted disease exposure Rx: - Cervicovaginal ancillary only  2. Labia irritation Rx: - Herpes simplex virus culture  3. Abnormal uterine bleeding (AUB) Rx: - US PELVIC COMPLETE WITH TRANSVAGINAL; Future  4. Screening breast examination Rx: - MM Digital Screening; Future    Plan    Follow up prn  Orders Placed This Encounter  Procedures  . Herpes simplex virus culture  . US PELVIC COMPLETE WITH TRANSVAGINAL    Standing Status:   Future    Standing Expiration Date:   11/15/2018    Order Specific Question:   Reason for Exam (SYMPTOM  OR DIAGNOSIS REQUIRED)    Answer:   AUB    Order Specific Question:   Preferred imaging location?    Answer[facility]  . MM Digital Screening  Standing Status:   Future    Standing Expiration Date:   11/15/2018    Order Specific Question:   Reason for Exam (SYMPTOM  OR DIAGNOSIS REQUIRED)    Answer:   Screening    Order Specific Question:   Is the patient pregnant?    Answer:   No    Order Specific Question:   Preferred imaging location?    Answer:   [facility]   Meds ordered this encounter  Medications  . spironolactone (ALDACTONE) 50  MG tablet    Sig: Take 1 tablet by mouth daily.

## 2017-09-16 LAB — CERVICOVAGINAL ANCILLARY ONLY
Bacterial vaginitis: POSITIVE — AB
Candida vaginitis: POSITIVE — AB
Chlamydia: NEGATIVE
Neisseria Gonorrhea: NEGATIVE
TRICH (WINDOWPATH): NEGATIVE

## 2017-09-17 ENCOUNTER — Telehealth: Payer: Self-pay

## 2017-09-17 ENCOUNTER — Other Ambulatory Visit: Payer: Self-pay | Admitting: Obstetrics

## 2017-09-17 DIAGNOSIS — N760 Acute vaginitis: Principal | ICD-10-CM

## 2017-09-17 DIAGNOSIS — B9689 Other specified bacterial agents as the cause of diseases classified elsewhere: Secondary | ICD-10-CM

## 2017-09-17 DIAGNOSIS — B373 Candidiasis of vulva and vagina: Secondary | ICD-10-CM

## 2017-09-17 DIAGNOSIS — B3731 Acute candidiasis of vulva and vagina: Secondary | ICD-10-CM

## 2017-09-17 MED ORDER — FLUCONAZOLE 150 MG PO TABS: 150.0000 mg | ORAL_TABLET | Freq: Once | ORAL | 0 refills | Status: AC

## 2017-09-17 MED ORDER — METRONIDAZOLE 500 MG PO TABS: 500.0000 mg | ORAL_TABLET | Freq: Two times a day (BID) | ORAL | 2 refills | Status: DC

## 2017-09-17 NOTE — Telephone Encounter (Signed)
-----   Message from Brock Badharles A Harper, MD sent at 09/17/2017  6:17 AM EDT ----- Flagyl Rx for BV Diflucan Rx for yeast

## 2017-09-17 NOTE — Telephone Encounter (Signed)
Left VM message to call office.

## 2017-09-17 NOTE — Telephone Encounter (Signed)
-----   Message from Charles A Harper, MD sent at 09/17/2017  6:17 AM EDT ----- Flagyl Rx for BV Diflucan Rx for yeast 

## 2017-09-17 NOTE — Telephone Encounter (Signed)
Patient notified of results and rx 

## 2017-09-18 LAB — HERPES SIMPLEX VIRUS CULTURE

## 2017-09-21 ENCOUNTER — Ambulatory Visit (HOSPITAL_COMMUNITY): Payer: BC Managed Care – PPO

## 2017-09-22 ENCOUNTER — Ambulatory Visit (HOSPITAL_COMMUNITY): Payer: BC Managed Care – PPO

## 2017-10-02 ENCOUNTER — Other Ambulatory Visit: Payer: Self-pay | Admitting: Obstetrics

## 2017-10-04 NOTE — Telephone Encounter (Signed)
Refill request for Sprintec 28   Please send refill if approved.

## 2018-02-23 ENCOUNTER — Telehealth: Payer: Self-pay

## 2018-02-23 NOTE — Telephone Encounter (Signed)
S/w pt and she stated that she is having vaginal discharge, odor, bleeding after IC, and night sweats. Pt stated that she previuosly did not get U/S done, due to financial issues. Advised pt that scheduler would call back to schedule appt.

## 2018-03-01 ENCOUNTER — Encounter: Payer: Self-pay | Admitting: Obstetrics

## 2018-03-01 ENCOUNTER — Ambulatory Visit: Payer: BC Managed Care – PPO | Admitting: Obstetrics

## 2018-03-01 VITALS — BP 130/85 | HR 71 | Ht 64.0 in | Wt 124.0 lb

## 2018-03-01 DIAGNOSIS — N930 Postcoital and contact bleeding: Secondary | ICD-10-CM

## 2018-03-01 DIAGNOSIS — Z01411 Encounter for gynecological examination (general) (routine) with abnormal findings: Secondary | ICD-10-CM

## 2018-03-01 DIAGNOSIS — Z1151 Encounter for screening for human papillomavirus (HPV): Secondary | ICD-10-CM

## 2018-03-01 DIAGNOSIS — N898 Other specified noninflammatory disorders of vagina: Secondary | ICD-10-CM

## 2018-03-01 DIAGNOSIS — Z124 Encounter for screening for malignant neoplasm of cervix: Secondary | ICD-10-CM

## 2018-03-01 DIAGNOSIS — Z113 Encounter for screening for infections with a predominantly sexual mode of transmission: Secondary | ICD-10-CM | POA: Diagnosis not present

## 2018-03-01 DIAGNOSIS — N72 Inflammatory disease of cervix uteri: Secondary | ICD-10-CM | POA: Diagnosis not present

## 2018-03-01 DIAGNOSIS — Z01419 Encounter for gynecological examination (general) (routine) without abnormal findings: Secondary | ICD-10-CM

## 2018-03-01 MED ORDER — CLINDAMYCIN HCL 300 MG PO CAPS: 300.0000 mg | ORAL_CAPSULE | Freq: Three times a day (TID) | ORAL | 0 refills | Status: DC

## 2018-03-01 NOTE — Progress Notes (Signed)
Presents for malodorous vaginal discharge, bleeding after sex and night sweats x 1 month.

## 2018-03-01 NOTE — Progress Notes (Signed)
Patient ID: Theresa Fitzgerald, female   DOB: 1971/09/03, 47 y.o.   MRN: [id number]  Chief Complaint  Patient presents with  . Vaginal Discharge    HPI Theresa Fitzgerald is a 47 y.o. female.  Malodorous vaginal discharge and bleeding after intercourse.  Denies pain with intercourse. HPI  History reviewed. No pertinent past medical history.  History reviewed. No pertinent surgical history.  Family History  Problem Relation Age of Onset  . Hypertension Father   . Hyperlipidemia Father   . Hypertension Mother   . Hyperlipidemia Mother     Social History Social History   Tobacco Use  . Smoking status: Never Smoker  . Smokeless tobacco: Never Used  Substance Use Topics  . Alcohol use: No    Alcohol/week: 0.0 oz  . Drug use: No    No Known Allergies  Current Outpatient Medications  Medication Sig Dispense Refill  . SPRINTEC 28 0.25-35 MG-MCG tablet TAKE 1 TABLET BY MOUTH EVERY DAY 28 tablet 11  . clindamycin (CLEOCIN) 300 MG capsule Take 1 capsule (300 mg total) by mouth 3 (three) times daily. 30 capsule 0  . metroNIDAZOLE (FLAGYL) 500 MG tablet Take 1 tablet (500 mg total) by mouth 2 (two) times daily. (Patient not taking: Reported on 03/01/2018) 14 tablet 2  . spironolactone (ALDACTONE) 50 MG tablet Take 1 tablet by mouth daily.     No current facility-administered medications for this visit.     Review of Systems Review of Systems Constitutional: negative for fatigue and weight loss Respiratory: negative for cough and wheezing Cardiovascular: negative for chest pain, fatigue and palpitations Gastrointestinal: negative for abdominal pain and change in bowel habits Genitourinary:positive for malodorous vaginal discharge and bleeding with intercourse Integument/breast: negative for nipple discharge Musculoskeletal:negative for myalgias Neurological: negative for gait problems and tremors Behavioral/Psych: negative for abusive relationship,  depression Endocrine: negative for temperature intolerance      Blood pressure 130/85, pulse 71, height 5\' 4"  (1.626 m), weight 124 lb (56.2 kg), last menstrual period 02/01/2018.  Physical Exam Physical Exam           General:  Alert and no distress          Heart:  RRR          Lungs: Clear Abdomen:  normal findings: no organomegaly, soft, non-tender and no hernia  Pelvis:  External genitalia: normal general appearance Urinary system: urethral meatus normal and bladder without fullness, nontender Vaginal: normal without tenderness, induration or masses Cervix: normal appearance Adnexa: normal bimanual exam Uterus: anteverted and non-tender, normal size    50% of 20 min visit spent on counseling and coordination of care.   Data Reviewed Labs  Assessment and Plan:    1. Encounter for routine gynecological examination with Papanicolaou smear of cervix Rx: - Cytology - PAP  2. Postcoital bleeding Rx: - clindamycin (CLEOCIN) 300 MG capsule; Take 1 capsule (300 mg total) by mouth 3 (three) times daily.  Dispense: 30 capsule; Refill: 0  3. Vaginal discharge Rx: - Cervicovaginal ancillary only - clindamycin (CLEOCIN) 300 MG capsule; Take 1 capsule (300 mg total) by mouth 3 (three) times daily.  Dispense: 30 capsule; Refill: 0  4. Chronic cervicitis Rx: - clindamycin (CLEOCIN) 300 MG capsule; Take 1 capsule (300 mg total) by mouth 3 (three) times daily.  Dispense: 30 capsule; Refill: 0    Plan    Follow up prn  No orders of the defined types were placed in this encounter.  Meds ordered this  encounter  Medications  . clindamycin (CLEOCIN) 300 MG capsule    Sig: Take 1 capsule (300 mg total) by mouth 3 (three) times daily.    Dispense:  30 capsule    Refill:  0    Brock Bad MD 03-01-2018

## 2018-03-02 ENCOUNTER — Encounter: Payer: Self-pay | Admitting: Obstetrics

## 2018-03-02 ENCOUNTER — Other Ambulatory Visit: Payer: Self-pay | Admitting: Obstetrics

## 2018-03-02 LAB — CERVICOVAGINAL ANCILLARY ONLY
BACTERIAL VAGINITIS: POSITIVE — AB
CANDIDA VAGINITIS: NEGATIVE
TRICH (WINDOWPATH): NEGATIVE

## 2018-03-03 ENCOUNTER — Other Ambulatory Visit: Payer: Self-pay | Admitting: Obstetrics

## 2018-03-03 LAB — CYTOLOGY - PAP
Diagnosis: NEGATIVE
HPV: NOT DETECTED

## 2018-03-03 MED ORDER — METRONIDAZOLE 0.75 % VA GEL: VAGINAL | 6 refills | Status: DC

## 2018-08-29 ENCOUNTER — Other Ambulatory Visit: Payer: Self-pay | Admitting: Obstetrics

## 2019-06-15 ENCOUNTER — Other Ambulatory Visit: Payer: Self-pay | Admitting: Obstetrics

## 2019-06-15 DIAGNOSIS — Z3041 Encounter for surveillance of contraceptive pills: Secondary | ICD-10-CM

## 2019-09-30 ENCOUNTER — Other Ambulatory Visit: Payer: Self-pay | Admitting: Obstetrics

## 2020-09-16 ENCOUNTER — Other Ambulatory Visit: Payer: Self-pay | Admitting: Obstetrics

## 2021-09-01 ENCOUNTER — Other Ambulatory Visit: Payer: Self-pay

## 2021-09-01 ENCOUNTER — Ambulatory Visit: Payer: BC Managed Care – PPO | Admitting: Family Medicine

## 2021-09-01 ENCOUNTER — Encounter: Payer: Self-pay | Admitting: Family Medicine

## 2021-09-01 VITALS — BP 104/71 | HR 76 | Temp 98.6°F | Ht 63.0 in | Wt 119.0 lb

## 2021-09-01 DIAGNOSIS — Z2821 Immunization not carried out because of patient refusal: Secondary | ICD-10-CM | POA: Diagnosis not present

## 2021-09-01 DIAGNOSIS — L700 Acne vulgaris: Secondary | ICD-10-CM | POA: Insufficient documentation

## 2021-09-01 DIAGNOSIS — L659 Nonscarring hair loss, unspecified: Secondary | ICD-10-CM | POA: Insufficient documentation

## 2021-09-01 DIAGNOSIS — Z7689 Persons encountering health services in other specified circumstances: Secondary | ICD-10-CM

## 2021-09-01 DIAGNOSIS — Z789 Other specified health status: Secondary | ICD-10-CM | POA: Insufficient documentation

## 2021-09-01 HISTORY — DX: Nonscarring hair loss, unspecified: L65.9

## 2021-09-01 MED ORDER — SPIRONOLACTONE 50 MG PO TABS: 50.0000 mg | ORAL_TABLET | Freq: Every day | ORAL | 1 refills | Status: DC

## 2021-09-01 NOTE — Patient Instructions (Addendum)
   Great to see you today.  I have refilled the medication(s) we provide.   If labs were collected, we will inform you of lab results once received either by echart message or telephone call.   - echart message- for normal results that have been seen by the patient already.   - telephone call: abnormal results or if patient has not viewed results in their echart.  Make lab appt early next week.

## 2021-09-01 NOTE — Progress Notes (Signed)
Patient ID: Theresa Fitzgerald, female  DOB: Mar 18, 1971, 50 y.o.   MRN: [id number] Patient Care Team    Relationship Specialty Notifications Start End  Natalia Leatherwood, DO PCP - General Family Medicine  09/01/21   Brock Bad, MD Consulting Physician Obstetrics and Gynecology  09/01/21     Chief Complaint  Patient presents with   Establish Care    Pt is not fasting;    Acne    See derm would like to have PCP take over care   Alopecia    Pt states that she is vegan but has had some recent hair loss x 6 mos;     Subjective: Theresa Fitzgerald is a 50 y.o.  female present for new patient establishment. All past medical history, surgical history, allergies, family history, immunizations, medications and social history were updated in the electronic medical record today. All recent labs, ED visits and hospitalizations within the last year were reviewed.  Acne: Pt endorses cystic acne history. She has been tried on many face washes and gels over the years. She has been prescribed spirolactone 50 mg qd for about 8 yrs. She reports her acne is well controlled on this medication. She had seen dermatology for this medication in the past. Her dermatologist is no longer practicing (cosmetics only).  Hair loss: Pt reports she has noticed increased hair thinning over the last 6 mos. She denies breakage of hair or patchy spots of hair loss. She has been a vegan for 2 decades. Her diet has not changed and she takes added iron, mag and a MV. She denies recent illness. She does endorse increase stress of lately d/t her mother's illness and care.   Depression screen PHQ 2/9 09/01/2021  Decreased Interest 0  Down, Depressed, Hopeless 0  PHQ - 2 Score 0   No flowsheet data found.      No flowsheet data found.   There is no immunization history on file for this patient.  No results found.  Past Medical History:  Diagnosis Date   Acne    Dysmenorrhea 01/31/2016   UTI  (urinary tract infection)    Vegan diet 2002   No Known Allergies Past Surgical History:  Procedure Laterality Date   WISDOM TOOTH EXTRACTION     Family History  Problem Relation Age of Onset   Stroke Mother    Hypertension Mother    Hyperlipidemia Mother    Hearing loss Mother    Polymyalgia rheumatica Mother        Giant cell arteritis.   Social History   Social History Narrative   Marital status/children/pets: Divorced. 1 child.    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 If you have any questions, ask your nurse or doctor.          STOP taking these medications    clindamycin 300 MG capsule Commonly known as: Cleocin Stopped by: Felix Pacini, DO   metroNIDAZOLE 0.75 % vaginal gel Commonly known as: METROGEL VAGINAL Stopped by: Felix Pacini, DO   metroNIDAZOLE 500 MG tablet Commonly known as: FLAGYL Stopped  by: Felix Pacini, DO       TAKE these medications    norgestimate-ethinyl estradiol 0.25-35 MG-MCG tablet Commonly known as: ORTHO-CYCLEN TAKE 1 TABLET BY MOUTH EVERY DAY   spironolactone 50 MG tablet Commonly known as: ALDACTONE Take 1 tablet (50 mg total) by mouth daily.        All past medical history, surgical history, allergies, family history, immunizations andmedications were updated in the EMR today and reviewed under the history and medication portions of their EMR.    No results found for this or any previous visit (from the past 2160 hour(s)).   ROS: 14 pt review of systems performed and negative (unless mentioned in an HPI)  Objective: BP 104/71   Pulse 76   Temp 98.6 F (37 C) (Oral)   Ht 5\' 3"  (1.6 m)   Wt 119 lb (54 kg)   LMP 08/26/2021   SpO2 98%   BMI 21.08 kg/m   Gen: Afebrile. No acute distress. Nontoxic in appearance, well-developed, well-nourished,  pleasant female.  HENT: AT. [place]. Thin hair-diffusly, normal scalp w/o dryness. No patchy hair loss.  Eyes:Pupils Equal Round Reactive to light, Extraocular movements intact,  Conjunctiva without redness, discharge or icterus. Skin: no rashes, purpura or petechiae. Warm and well-perfused. Skin intact. No evidence of cystic acne or closed comedone.  Neuro/Msk: Normal gait. PERLA. EOMi. Alert. Oriented x3 Psych: Normal affect, dress and demeanor. Normal speech. Normal thought content and judgment.  Assessment/plan: Theresa Fitzgerald is a 50 y.o. female present for est care.  Establishing care with new doctor, encounter for Acne vulgaris Stable.  Long term spiro 50 mg qd (> 65yrs) for acne.  Bmp ordered.   Hair loss/vegan diet Vegan diet could be contributing to vit. Deficiency and thus hair thinning. Her diet has been constant over two decades.  - Iron, TIBC and Ferritin Panel; Future - VITAMIN D 25 Hydroxy (Vit-D Deficiency, Fractures); Future - TSH; Future - Basic Metabolic Panel (BMET); Future   Influenza vaccination declined by patient  Return in about 1 week (around 09/08/2021) for lab appt (non-fasting).  Orders Placed This Encounter  Procedures   Iron, TIBC and Ferritin Panel   VITAMIN D 25 Hydroxy (Vit-D Deficiency, Fractures)   TSH   Basic Metabolic Panel (BMET)   Meds ordered this encounter  Medications   spironolactone (ALDACTONE) 50 MG tablet    Sig: Take 1 tablet (50 mg total) by mouth daily.    Dispense:  90 tablet    Refill:  1   Referral Orders  No referral(s) requested today     Note is dictated utilizing voice recognition software. Although note has been proof read prior to signing, occasional typographical errors still can be missed. If any questions arise, please do not hesitate to call for verification.  Electronically signed by: 11/08/2021,  DO [facility] [place]

## 2021-09-03 ENCOUNTER — Ambulatory Visit: Payer: BC Managed Care – PPO

## 2021-09-08 ENCOUNTER — Encounter: Payer: Self-pay | Admitting: Family Medicine

## 2021-09-09 ENCOUNTER — Other Ambulatory Visit: Payer: Self-pay

## 2021-09-09 ENCOUNTER — Ambulatory Visit (INDEPENDENT_AMBULATORY_CARE_PROVIDER_SITE_OTHER): Payer: BC Managed Care – PPO

## 2021-09-09 DIAGNOSIS — Z789 Other specified health status: Secondary | ICD-10-CM

## 2021-09-09 DIAGNOSIS — L659 Nonscarring hair loss, unspecified: Secondary | ICD-10-CM | POA: Diagnosis not present

## 2021-09-09 LAB — VITAMIN D 25 HYDROXY (VIT D DEFICIENCY, FRACTURES): VITD: 42.22 ng/mL (ref 30.00–100.00)

## 2021-09-09 LAB — BASIC METABOLIC PANEL
BUN: 9 mg/dL (ref 6–23)
CO2: 26 mEq/L (ref 19–32)
Calcium: 8.8 mg/dL (ref 8.4–10.5)
Chloride: 103 mEq/L (ref 96–112)
Creatinine, Ser: 0.64 mg/dL (ref 0.40–1.20)
GFR: 104.95 mL/min (ref >60.00)
Glucose, Bld: 80 mg/dL (ref 70–99)
Potassium: 4.3 mEq/L (ref 3.5–5.1)
Sodium: 137 mEq/L (ref 135–145)

## 2021-09-09 LAB — TSH: TSH: 1.37 u[IU]/mL (ref 0.35–5.50)

## 2021-09-10 LAB — IRON,TIBC AND FERRITIN PANEL
%SAT: 39 % (calc) (ref 16–45)
Ferritin: 16 ng/mL (ref 16–232)
Iron: 149 ug/dL (ref 40–190)
TIBC: 385 mcg/dL (calc) (ref 250–450)

## 2021-09-11 ENCOUNTER — Encounter: Payer: Self-pay | Admitting: Family Medicine

## 2021-09-11 NOTE — Telephone Encounter (Signed)
Please advise 

## 2021-11-04 ENCOUNTER — Other Ambulatory Visit: Payer: Self-pay | Admitting: Obstetrics

## 2021-12-17 ENCOUNTER — Encounter: Payer: Self-pay | Admitting: Family Medicine

## 2021-12-17 ENCOUNTER — Other Ambulatory Visit: Payer: Self-pay

## 2021-12-17 ENCOUNTER — Ambulatory Visit (INDEPENDENT_AMBULATORY_CARE_PROVIDER_SITE_OTHER): Payer: BC Managed Care – PPO | Admitting: Family Medicine

## 2021-12-17 VITALS — BP 118/75 | HR 72 | Temp 98.6°F | Ht 63.0 in | Wt 122.2 lb

## 2021-12-17 DIAGNOSIS — Z79890 Hormone replacement therapy (postmenopausal): Secondary | ICD-10-CM | POA: Insufficient documentation

## 2021-12-17 DIAGNOSIS — L700 Acne vulgaris: Secondary | ICD-10-CM | POA: Diagnosis not present

## 2021-12-17 DIAGNOSIS — Z0000 Encounter for general adult medical examination without abnormal findings: Secondary | ICD-10-CM

## 2021-12-17 DIAGNOSIS — Z789 Other specified health status: Secondary | ICD-10-CM | POA: Diagnosis not present

## 2021-12-17 DIAGNOSIS — Z131 Encounter for screening for diabetes mellitus: Secondary | ICD-10-CM

## 2021-12-17 DIAGNOSIS — Z823 Family history of stroke: Secondary | ICD-10-CM

## 2021-12-17 DIAGNOSIS — Z2821 Immunization not carried out because of patient refusal: Secondary | ICD-10-CM

## 2021-12-17 DIAGNOSIS — Z1211 Encounter for screening for malignant neoplasm of colon: Secondary | ICD-10-CM

## 2021-12-17 DIAGNOSIS — Z1159 Encounter for screening for other viral diseases: Secondary | ICD-10-CM

## 2021-12-17 DIAGNOSIS — Z1231 Encounter for screening mammogram for malignant neoplasm of breast: Secondary | ICD-10-CM

## 2021-12-17 DIAGNOSIS — Z79899 Other long term (current) drug therapy: Secondary | ICD-10-CM

## 2021-12-17 MED ORDER — SPIRONOLACTONE 50 MG PO TABS: 50.0000 mg | ORAL_TABLET | Freq: Every day | ORAL | 1 refills | Status: DC

## 2021-12-17 NOTE — Progress Notes (Signed)
This visit occurred during the SARS-CoV-2 public health emergency.  Safety protocols were in place, including screening questions prior to the visit, additional usage of staff PPE, and extensive cleaning of exam room while observing appropriate contact time as indicated for disinfecting solutions.    Patient ID: Theresa Fitzgerald, female  DOB: 1971/05/24, 51 y.o.   MRN: [id number] Patient Care Team    Relationship Specialty Notifications Start End  Natalia Leatherwood, DO PCP - General Family Medicine  09/01/21   Brock Bad, MD Consulting Physician Obstetrics and Gynecology  09/01/21     Chief Complaint  Patient presents with   Annual Exam    CPE, no concerns. Had peanuts around 6am and black coffee.     Subjective: Theresa Fitzgerald is a 51 y.o.  Female  present for CPE. All past medical history, surgical history, allergies, family history, immunizations, medications and social history were updated in the electronic medical record today. All recent labs, ED visits and hospitalizations within the last year were reviewed.  Health maintenance:  Colonoscopy: No fhx. Cologuard ordered today  Mammogram: no prior. No fhx. Ordered today at [facility] Cervical cancer screening: last pap: 2019, (GYN- Dr. Clearance Coots has upcoming appt.  Immunizations: tdap declined, Influenza declined (encouraged yearly), covid declined Infectious disease screening: HIV and Hep C declined.  DEXA:routine screen 60 Assistive device: none Oxygen ACZ:YSAY Patient has a Dental home. Hospitalizations/ED visits: reviewed  Acne: Pt endorses cystic acne history. She has been tried on many face washes and gels over the years. She has been prescribed spirolactone 50 mg qd for about 8 yrs. She reports her acne is well controlled on spiro medication. She had seen dermatology for this medication in the past. Her dermatologist is no longer practicing (cosmetics only).    Depression screen [facility] 2/9 12/17/2021  12/17/2021 09/01/2021  Decreased Interest 0 0 0  Down, Depressed, Hopeless 0 0 0  PHQ - 2 Score 0 0 0  Altered sleeping 0 - -  Tired, decreased energy 0 - -  Change in appetite 0 - -  Feeling bad or failure about yourself  0 - -  Trouble concentrating 0 - -  Moving slowly or fidgety/restless 0 - -  Suicidal thoughts 0 - -  PHQ-9 Score 0 - -  Difficult doing work/chores Not difficult at all - -   GAD 7 : Generalized Anxiety Score 12/17/2021  Nervous, Anxious, on Edge 1  Control/stop worrying 1  Worry too much - different things 1  Trouble relaxing 1  Restless 1  Easily annoyed or irritable 1  Afraid - awful might happen 0  Total GAD 7 Score 6  Anxiety Difficulty Somewhat difficult     Immunization History  Administered Date(s) Administered   PFIZER(Purple Top)SARS-COV-2 Vaccination 01/29/2020, 02/26/2020   Past Medical History:  Diagnosis Date   Acne    Dysmenorrhea 01/31/2016   UTI (urinary tract infection)    Vegan diet 2002   No Known Allergies Past Surgical History:  Procedure Laterality Date   WISDOM TOOTH EXTRACTION     Family History  Problem Relation Age of Onset   Stroke Mother    Hypertension Mother    Hyperlipidemia Mother    Hearing loss Mother    Polymyalgia rheumatica Mother        Giant cell arteritis.   Social History   Social History Narrative   Marital status/children/pets: Divorced. 1 child.    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 If you have any questions, ask your nurse or doctor.          spironolactone 50 MG tablet Commonly known as: ALDACTONE Take 1 tablet (50 mg total) by mouth daily.   Sprintec 28 0.25-35 MG-MCG tablet Generic drug: norgestimate-ethinyl  estradiol TAKE 1 TABLET BY MOUTH EVERY DAY        All past medical history, surgical history, allergies, family history, immunizations andmedications were updated in the EMR today and reviewed under the history and medication portions of their EMR.     No results found for this or any previous visit (from the past 2160 hour(s)).     ROS 14 pt review of systems performed and negative (unless mentioned in an HPI)  Objective:  BP 118/75 (BP Location: Left Arm, Patient Position: Sitting, Cuff Size: Normal)    Pulse 72    Temp 98.6 F (37 C) (Temporal)    Ht 5\' 3"  (1.6 m)    Wt 122 lb 3.2 oz (55.4 kg)    SpO2 100%    BMI 21.65 kg/m  Physical Exam Vitals and nursing note reviewed.  Constitutional:      General: She is not in acute distress.    Appearance: Normal appearance. She is not ill-appearing or toxic-appearing.  HENT:     Head: Normocephalic and atraumatic.     Right Ear: Tympanic membrane, ear canal and external ear normal. There is no impacted cerumen.     Left Ear: Tympanic membrane, ear canal and external ear normal. There is no impacted cerumen.     Nose: No congestion or rhinorrhea.     Mouth/Throat:     Mouth: Mucous membranes are moist.     Pharynx: Oropharynx is clear. No oropharyngeal exudate or posterior oropharyngeal erythema.  Eyes:     General: No scleral icterus.       Right eye: No discharge.        Left eye: No discharge.     Extraocular Movements: Extraocular movements intact.     Conjunctiva/sclera: Conjunctivae normal.     Pupils: Pupils are equal, round, and reactive to light.  Cardiovascular:     Rate and Rhythm: Normal rate and regular rhythm.     Pulses: Normal pulses.     Heart sounds: Normal heart sounds. No murmur heard.   No friction rub. No gallop.  Pulmonary:     Effort: Pulmonary effort is normal. No respiratory distress.     Breath sounds: Normal breath sounds. No stridor. No wheezing, rhonchi or rales.  Chest:     Chest wall: No  tenderness.  Abdominal:     General: Abdomen is flat. Bowel sounds are normal. There is no distension.     Palpations: Abdomen is soft. There is no mass.     Tenderness: There is no abdominal tenderness. There is no right CVA tenderness, left CVA tenderness, guarding or rebound.     Hernia: No hernia is present.  Musculoskeletal:        General: No swelling, tenderness or deformity. Normal range of motion.     Cervical back: Normal range of motion and neck supple.  No rigidity or tenderness.     Right lower leg: No edema.     Left lower leg: No edema.  Lymphadenopathy:     Cervical: No cervical adenopathy.  Skin:    General: Skin is warm and dry.     Coloration: Skin is not jaundiced or pale.     Findings: No bruising, erythema, lesion or rash.  Neurological:     General: No focal deficit present.     Mental Status: She is alert and oriented to person, place, and time. Mental status is at baseline.     Cranial Nerves: No cranial nerve deficit.     Sensory: No sensory deficit.     Motor: No weakness.     Coordination: Coordination normal.     Gait: Gait normal.     Deep Tendon Reflexes: Reflexes normal.  Psychiatric:        Mood and Affect: Mood normal.        Behavior: Behavior normal.        Thought Content: Thought content normal.        Judgment: Judgment normal.     No results found.  Assessment/plan: Theresa Fitzgerald is a 51 y.o. female present for CPE Acne vulgaris Stable.  Continue spiro 50 mg qd (> 5881yrs) for acne.  Cmp collected today F/u 5.5 mos.   Influenza vaccination declined by patient Long-term current use hormone replacement therapy (BCP) - CBC with Differential/Platelet - Lipid panel Encounter for long-term current use of medication - Comprehensive metabolic panel Need for hepatitis C screening test declined Diabetes mellitus screening - Hemoglobin A1c Colon cancer screening - Cologuard Breast cancer screening by mammogram - MM 3D SCREEN  BREAST BILATERAL; Future Routine general medical examination at a health care facility Colonoscopy: No fhx. Cologuard ordered today  Mammogram: no prior. No fhx. Ordered today at [facility] Cervical cancer screening: last pap: 2019, (GYN- Dr. Clearance CootsHarper)> has upcoming appt.  Immunizations: tdap declined, Influenza declined (encouraged yearly), covid declined Infectious disease screening: HIV and Hep C declined.  DEXA:routine screen 60 Patient was encouraged to exercise greater than 150 minutes a week. Patient was encouraged to choose a diet filled with fresh fruits and vegetables, and lean meats. AVS provided to patient today for education/recommendation on gender specific health and safety maintenance. Return in about 24 weeks (around 06/03/2022) for CMC (30 min).   Orders Placed This Encounter  Procedures   MM 3D SCREEN BREAST BILATERAL   CBC with Differential/Platelet   Comprehensive metabolic panel   Lipid panel   Hemoglobin A1c   Cologuard   Meds ordered this encounter  Medications   spironolactone (ALDACTONE) 50 MG tablet    Sig: Take 1 tablet (50 mg total) by mouth daily.    Dispense:  90 tablet    Refill:  1   Referral Orders  No referral(s) requested today     Electronically signed by: Felix Pacinienee Adeoluwa Silvers, DO [facility]

## 2021-12-17 NOTE — Patient Instructions (Signed)
°Great to see you today.  °I have refilled the medication(s) we provide.  ° °If labs were collected, we will inform you of lab results once received either by echart message or telephone call.  ° - echart message- for normal results that have been seen by the patient already.  ° - telephone call: abnormal results or if patient has not viewed results in their echart. ° °Health Maintenance, Female °Adopting a healthy lifestyle and getting preventive care are important in promoting health and wellness. Ask your health care provider about: °The right schedule for you to have regular tests and exams. °Things you can do on your own to prevent diseases and keep yourself healthy. °What should I know about diet, weight, and exercise? °Eat a healthy diet ° °Eat a diet that includes plenty of vegetables, fruits, low-fat dairy products, and lean protein. °Do not eat a lot of foods that are high in solid fats, added sugars, or sodium. °Maintain a healthy weight °Body mass index (BMI) is used to identify weight problems. It estimates body fat based on height and weight. Your health care provider can help determine your BMI and help you achieve or maintain a healthy weight. °Get regular exercise °Get regular exercise. This is one of the most important things you can do for your health. Most adults should: °Exercise for at least 150 minutes each week. The exercise should increase your heart rate and make you sweat (moderate-intensity exercise). °Do strengthening exercises at least twice a week. This is in addition to the moderate-intensity exercise. °Spend less time sitting. Even light physical activity can be beneficial. °Watch cholesterol and blood lipids °Have your blood tested for lipids and cholesterol at 49 years of age, then have this test every 5 years. °Have your cholesterol levels checked more often if: °Your lipid or cholesterol levels are high. °You are older than 49 years of age. °You are at high risk for heart  disease. °What should I know about cancer screening? °Depending on your health history and family history, you may need to have cancer screening at various ages. This may include screening for: °Breast cancer. °Cervical cancer. °Colorectal cancer. °Skin cancer. °Lung cancer. °What should I know about heart disease, diabetes, and high blood pressure? °Blood pressure and heart disease °High blood pressure causes heart disease and increases the risk of stroke. This is more likely to develop in people who have high blood pressure readings or are overweight. °Have your blood pressure checked: °Every 3-5 years if you are 18-39 years of age. °Every year if you are 40 years old or older. °Diabetes °Have regular diabetes screenings. This checks your fasting blood sugar level. Have the screening done: °Once every three years after age 40 if you are at a normal weight and have a low risk for diabetes. °More often and at a younger age if you are overweight or have a high risk for diabetes. °What should I know about preventing infection? °Hepatitis B °If you have a higher risk for hepatitis B, you should be screened for this virus. Talk with your health care provider to find out if you are at risk for hepatitis B infection. °Hepatitis C °Testing is recommended for: °Everyone born from 1945 through 1965. °Anyone with known risk factors for hepatitis C. °Sexually transmitted infections (STIs) °Get screened for STIs, including gonorrhea and chlamydia, if: °You are sexually active and are younger than 49 years of age. °You are older than 49 years of age and your health care provider   tells you that you are at risk for this type of infection. °Your sexual activity has changed since you were last screened, and you are at increased risk for chlamydia or gonorrhea. Ask your health care provider if you are at risk. °Ask your health care provider about whether you are at high risk for HIV. Your health care provider may recommend a  prescription medicine to help prevent HIV infection. If you choose to take medicine to prevent HIV, you should first get tested for HIV. You should then be tested every 3 months for as long as you are taking the medicine. °Pregnancy °If you are about to stop having your period (premenopausal) and you may become pregnant, seek counseling before you get pregnant. °Take 400 to 800 micrograms (mcg) of folic acid every day if you become pregnant. °Ask for birth control (contraception) if you want to prevent pregnancy. °Osteoporosis and menopause °Osteoporosis is a disease in which the bones lose minerals and strength with aging. This can result in bone fractures. If you are 65 years old or older, or if you are at risk for osteoporosis and fractures, ask your health care provider if you should: °Be screened for bone loss. °Take a calcium or vitamin D supplement to lower your risk of fractures. °Be given hormone replacement therapy (HRT) to treat symptoms of menopause. °Follow these instructions at home: °Alcohol use °Do not drink alcohol if: °Your health care provider tells you not to drink. °You are pregnant, may be pregnant, or are planning to become pregnant. °If you drink alcohol: °Limit how much you have to: °0-1 drink a day. °Know how much alcohol is in your drink. In the [place] one drink equals one 12 oz bottle of beer (355 mL), one 5 oz glass of wine (148 mL), or one 1½ oz glass of hard liquor (44 mL). °Lifestyle °Do not use any products that contain nicotine or tobacco. These products include cigarettes, chewing tobacco, and vaping devices, such as e-cigarettes. If you need help quitting, ask your health care provider. °Do not use street drugs. °Do not share needles. °Ask your health care provider for help if you need support or information about quitting drugs. °General instructions °Schedule regular health, dental, and eye exams. °Stay current with your vaccines. °Tell your health care provider if: °You often  feel depressed. °You have ever been abused or do not feel safe at home. °Summary °Adopting a healthy lifestyle and getting preventive care are important in promoting health and wellness. °Follow your health care provider's instructions about healthy diet, exercising, and getting tested or screened for diseases. °Follow your health care provider's instructions on monitoring your cholesterol and blood pressure. °This information is not intended to replace advice given to you by your health care provider. Make sure you discuss any questions you have with your health care provider. °Document Revised: 04/07/2021 Document Reviewed: 04/07/2021 °Elsevier Patient Education © 2022 [employer] ° °

## 2021-12-18 LAB — COMPREHENSIVE METABOLIC PANEL
AG Ratio: 1.7 (calc) (ref 1.0–2.5)
ALT: 16 U/L (ref 6–29)
AST: 17 U/L (ref 10–35)
Albumin: 4.1 g/dL (ref 3.6–5.1)
Alkaline phosphatase (APISO): 73 U/L (ref 31–125)
BUN: 12 mg/dL (ref 7–25)
CO2: 29 mmol/L (ref 20–32)
Calcium: 8.9 mg/dL (ref 8.6–10.2)
Chloride: 102 mmol/L (ref 98–110)
Creat: 0.66 mg/dL (ref 0.50–0.99)
Globulin: 2.4 g/dL (calc) (ref 1.9–3.7)
Glucose, Bld: 77 mg/dL (ref 65–99)
Potassium: 4.2 mmol/L (ref 3.5–5.3)
Sodium: 138 mmol/L (ref 135–146)
Total Bilirubin: 0.4 mg/dL (ref 0.2–1.2)
Total Protein: 6.5 g/dL (ref 6.1–8.1)

## 2021-12-18 LAB — LIPID PANEL
Cholesterol: 248 mg/dL — ABNORMAL HIGH (ref <200)
HDL: 69 mg/dL (ref >=50)
LDL Cholesterol (Calc): 162 mg/dL (calc) — ABNORMAL HIGH
Non-HDL Cholesterol (Calc): 179 mg/dL (calc) — ABNORMAL HIGH (ref <130)
Total CHOL/HDL Ratio: 3.6 (calc) (ref <5.0)
Triglycerides: 73 mg/dL (ref <150)

## 2021-12-18 LAB — CBC WITH DIFFERENTIAL/PLATELET
Absolute Monocytes: 333 cells/uL (ref 200–950)
Basophils Absolute: 38 cells/uL (ref 0–200)
Basophils Relative: 0.6 %
Eosinophils Absolute: 109 cells/uL (ref 15–500)
Eosinophils Relative: 1.7 %
HCT: 38.4 % (ref 35.0–45.0)
Hemoglobin: 13.1 g/dL (ref 11.7–15.5)
Lymphs Abs: 2323 cells/uL (ref 850–3900)
MCH: 30.5 pg (ref 27.0–33.0)
MCHC: 34.1 g/dL (ref 32.0–36.0)
MCV: 89.5 fL (ref 80.0–100.0)
MPV: 11.5 fL (ref 7.5–12.5)
Monocytes Relative: 5.2 %
Neutro Abs: 3597 cells/uL (ref 1500–7800)
Neutrophils Relative %: 56.2 %
Platelets: 265 10*3/uL (ref 140–400)
RBC: 4.29 10*6/uL (ref 3.80–5.10)
RDW: 11.8 % (ref 11.0–15.0)
Total Lymphocyte: 36.3 %
WBC: 6.4 10*3/uL (ref 3.8–10.8)

## 2021-12-18 LAB — HEMOGLOBIN A1C
Hgb A1c MFr Bld: 4.5 % of total Hgb (ref <5.7)
Mean Plasma Glucose: 82 mg/dL
eAG (mmol/L): 4.6 mmol/L

## 2022-01-27 ENCOUNTER — Other Ambulatory Visit: Payer: Self-pay

## 2022-01-27 ENCOUNTER — Ambulatory Visit
Admission: RE | Admit: 2022-01-27 | Discharge: 2022-01-27 | Disposition: A | Discharge status: Home / Self Care | Payer: BC Managed Care – PPO | Source: Ambulatory Visit | Attending: Family Medicine | Admitting: Family Medicine

## 2022-01-27 DIAGNOSIS — Z1231 Encounter for screening mammogram for malignant neoplasm of breast: Secondary | ICD-10-CM

## 2022-03-11 ENCOUNTER — Ambulatory Visit: Payer: BC Managed Care – PPO | Admitting: Obstetrics

## 2022-05-07 ENCOUNTER — Telehealth: Payer: Self-pay

## 2022-05-07 NOTE — Telephone Encounter (Signed)
Spoke with pt and asked if she has received Cologuard test. Pt stated that she has and will complete soon

## 2022-05-08 ENCOUNTER — Ambulatory Visit (INDEPENDENT_AMBULATORY_CARE_PROVIDER_SITE_OTHER): Payer: BC Managed Care – PPO | Admitting: Obstetrics

## 2022-05-08 ENCOUNTER — Encounter: Payer: Self-pay | Admitting: Obstetrics

## 2022-05-08 ENCOUNTER — Other Ambulatory Visit (HOSPITAL_COMMUNITY)
Admission: RE | Admit: 2022-05-08 | Discharge: 2022-05-08 | Disposition: A | Discharge status: Home / Self Care | Payer: BC Managed Care – PPO | Source: Ambulatory Visit | Attending: Obstetrics | Admitting: Obstetrics

## 2022-05-08 VITALS — BP 130/81 | HR 69 | Ht 64.0 in | Wt 123.0 lb

## 2022-05-08 DIAGNOSIS — N951 Menopausal and female climacteric states: Secondary | ICD-10-CM | POA: Insufficient documentation

## 2022-05-08 DIAGNOSIS — M545 Low back pain, unspecified: Secondary | ICD-10-CM | POA: Diagnosis not present

## 2022-05-08 DIAGNOSIS — N898 Other specified noninflammatory disorders of vagina: Secondary | ICD-10-CM | POA: Insufficient documentation

## 2022-05-08 DIAGNOSIS — N86 Erosion and ectropion of cervix uteri: Secondary | ICD-10-CM | POA: Insufficient documentation

## 2022-05-08 DIAGNOSIS — N939 Abnormal uterine and vaginal bleeding, unspecified: Secondary | ICD-10-CM

## 2022-05-08 DIAGNOSIS — Z01419 Encounter for gynecological examination (general) (routine) without abnormal findings: Secondary | ICD-10-CM | POA: Diagnosis present

## 2022-05-08 LAB — POCT URINALYSIS DIPSTICK
Bilirubin, UA: NEGATIVE
Glucose, UA: NEGATIVE
Ketones, UA: NEGATIVE
Nitrite, UA: NEGATIVE
Protein, UA: NEGATIVE
Spec Grav, UA: 1.015 (ref 1.010–1.025)
Urobilinogen, UA: 0.2 E.U./dL
pH, UA: 6.5 (ref 5.0–8.0)

## 2022-05-08 NOTE — Progress Notes (Signed)
Subjective:        Theresa Fitzgerald is a 51 y.o. female here for a routine exam.  Current complaints: Vaginal spotting between periods, and moderate vaginal bleeding with intercourse.  Also has occasional lower abdominal cramping.  Contraception:  OCP's..    Personal health questionnaire:  Is patient Ashkenazi Jewish, have a family history of breast and/or ovarian cancer: no Is there a family history of uterine cancer diagnosed at age < 59, gastrointestinal cancer, urinary tract cancer, family member who is a Personnel officer syndrome-associated carrier: no Is the patient overweight and hypertensive, family history of diabetes, personal history of gestational diabetes, preeclampsia or PCOS: no Is patient over 57, have PCOS,  family history of premature CHD under age 26, diabetes, smoke, have hypertension or peripheral artery disease:  no At any time, has a partner hit, kicked or otherwise hurt or frightened you?: no Over the past 2 weeks, have you felt down, depressed or hopeless?: no Over the past 2 weeks, have you felt little interest or pleasure in doing things?:no   Gynecologic History Patient's last menstrual period was 04/16/2022. Contraception: OCP (estrogen/progesterone) Last Pap: 2019. Results were: normal Last mammogram: 2023. Results were: normal  Obstetric History OB History  Gravida Para Term Preterm AB Living  2 1 1   1 1   SAB IAB Ectopic Multiple Live Births  1       1    # Outcome Date GA Lbr Len/2nd Weight Sex Delivery Anes PTL Lv  2 Term 2005        LIV  1 SAB 2003            Past Medical History:  Diagnosis Date   Acne    Dysmenorrhea 01/31/2016   UTI (urinary tract infection)    Vegan diet 2002    Past Surgical History:  Procedure Laterality Date   WISDOM TOOTH EXTRACTION       Current Outpatient Medications:    spironolactone (ALDACTONE) 50 MG tablet, Take 1 tablet (50 mg total) by mouth daily., Disp: 90 tablet, Rfl: 1   SPRINTEC 28 0.25-35  MG-MCG tablet, TAKE 1 TABLET BY MOUTH EVERY DAY, Disp: 84 tablet, Rfl: 4 No Known Allergies  Social History   Tobacco Use   Smoking status: Never    Passive exposure: Never   Smokeless tobacco: Never  Substance Use Topics   Alcohol use: No    Alcohol/week: 0.0 standard drinks of alcohol    Family History  Problem Relation Age of Onset   Stroke Mother    Hypertension Mother    Hyperlipidemia Mother    Hearing loss Mother    Polymyalgia rheumatica Mother        Giant cell arteritis.      Review of Systems  Constitutional: negative for fatigue and weight loss Respiratory: negative for cough and wheezing Cardiovascular: negative for chest pain, fatigue and palpitations Gastrointestinal: negative for abdominal pain and change in bowel habits Musculoskeletal:negative for myalgias Neurological: negative for gait problems and tremors Behavioral/Psych: negative for abusive relationship, depression Endocrine: negative for temperature intolerance    Genitourinary:negative for abnormal menstrual periods, genital lesions, hot flashes, sexual problems and vaginal discharge Integument/breast: negative for breast lump, breast tenderness, nipple discharge and skin lesion(s)    Objective:       BP 130/81   Pulse 69   Ht 5\' 4"  (1.626 m)   Wt 123 lb (55.8 kg)   LMP 04/16/2022   BMI 21.11 kg/m  General:  alert  Skin:   no rash or abnormalities  Lungs:   clear to auscultation bilaterally  Heart:   regular rate and rhythm, S1, S2 normal, no murmur, click, rub or gallop  Breasts:   normal without suspicious masses, skin or nipple changes or axillary nodes  Abdomen:  normal findings: no organomegaly, soft, non-tender and no hernia  Pelvis:  External genitalia: normal general appearance Urinary system: urethral meatus normal and bladder without fullness, nontender Vaginal: normal without tenderness, induration or masses Cervix: normal appearance Adnexa: normal bimanual exam Uterus:  anteverted and non-tender, normal size   Lab Review Urine pregnancy test Labs reviewed yes Radiologic studies reviewed yes  I have spent a total of 20 minutes of face-to-face time, excluding clinical staff time, reviewing notes and preparing to see patient, ordering tests and/or medications, and counseling the patient. 20  Assessment:    1. Encounter for gynecological examination with Papanicolaou smear of cervix Rx: - Cytology - PAP( [facility])  2. Perimenopause - doing well with mild-moderate night sweats  3. Abnormal uterine bleeding (AUB), possible anatomic etiology from a friable cervical ectropion - may benefit from cryocautery of the cervical ectropion if no other anatomic cause is found Rx: - US PELVIC COMPLETE WITH TRANSVAGINAL; Future  4. 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 Vaginal discharge - cervicovaginal ancillary only  6. Low back pain, unspecified back pain laterality, unspecified chronicity, unspecified whether sciatica present Rx: - POCT Urinalysis Dipstick - Urine Culture     Plan:    Education reviewed: calcium supplements, depression evaluation, low fat, low cholesterol diet, safe sex/STD prevention, self breast exams, skin cancer screening, and weight bearing exercise. Contraception: OCP (estrogen/progesterone). Follow up in: 2 weeks.    Orders Placed This Encounter  Procedures   Urine Culture   US PELVIC COMPLETE WITH TRANSVAGINAL    Standing Status:   Future    Standing Expiration Date:   05/09/2023    Order Specific Question:   Reason for Exam (SYMPTOM  OR DIAGNOSIS REQUIRED)    Answer:   AUB    Order Specific Question:   Preferred imaging location?    Answer:   [facility]   POCT Urinalysis Dipstick     Brock Bad, MD 05/08/2022 12:19 PM

## 2022-05-08 NOTE — Progress Notes (Signed)
Patient presents for a AEX, lower back pain, and bleeding after intercourse. She states that she has some degenerative disk issues, but the pain is also located in her lower abdomen. States that the back pain may be associated with her lower abdominal pain. Bleeding with intercourse has been a concern for the past year, but has gotten progressively worse and is occurring every time she has intercourse. She is also having breakthrough bleeding at other time throughout the month while taking pills. States that she has been experiencing increased vaginal discharge with having odor sometimes. Denies vaginal irritation.   Last pap: 03/01/18 Normal Last MM: 01/27/22 Normal

## 2022-05-10 LAB — URINE CULTURE

## 2022-05-11 LAB — CERVICOVAGINAL ANCILLARY ONLY
Bacterial Vaginitis (gardnerella): NEGATIVE
Candida Glabrata: NEGATIVE
Candida Vaginitis: NEGATIVE
Chlamydia: NEGATIVE
Comment: NEGATIVE
Comment: NEGATIVE
Comment: NEGATIVE
Comment: NEGATIVE
Comment: NEGATIVE
Comment: NORMAL
Neisseria Gonorrhea: NEGATIVE
Trichomonas: NEGATIVE

## 2022-05-11 LAB — CYTOLOGY - PAP
Comment: NEGATIVE
Diagnosis: NEGATIVE
High risk HPV: NEGATIVE

## 2022-05-13 ENCOUNTER — Ambulatory Visit (HOSPITAL_COMMUNITY): Payer: BC Managed Care – PPO

## 2022-05-27 ENCOUNTER — Ambulatory Visit: Payer: BC Managed Care – PPO | Admitting: Obstetrics and Gynecology

## 2022-09-16 ENCOUNTER — Other Ambulatory Visit: Payer: Self-pay | Admitting: Family Medicine

## 2022-09-17 ENCOUNTER — Telehealth: Payer: Self-pay | Admitting: Family Medicine

## 2022-09-17 NOTE — Telephone Encounter (Signed)
Pt is asking for a refill on the spironolactone at least until her appt on 10/30. She is concerned her face will break out. She currently has 7 pills left

## 2022-09-18 MED ORDER — SPIRONOLACTONE 50 MG PO TABS: 50.0000 mg | ORAL_TABLET | Freq: Every day | ORAL | 0 refills | Status: DC

## 2022-09-28 ENCOUNTER — Ambulatory Visit: Payer: BC Managed Care – PPO | Admitting: Family Medicine

## 2022-09-28 ENCOUNTER — Encounter: Payer: Self-pay | Admitting: Family Medicine

## 2022-09-28 VITALS — BP 123/85 | HR 73 | Temp 98.2°F | Wt 121.4 lb

## 2022-09-28 DIAGNOSIS — L700 Acne vulgaris: Secondary | ICD-10-CM | POA: Diagnosis not present

## 2022-09-28 DIAGNOSIS — L659 Nonscarring hair loss, unspecified: Secondary | ICD-10-CM | POA: Diagnosis not present

## 2022-09-28 DIAGNOSIS — F419 Anxiety disorder, unspecified: Secondary | ICD-10-CM | POA: Insufficient documentation

## 2022-09-28 HISTORY — DX: Anxiety disorder, unspecified: F41.9

## 2022-09-28 MED ORDER — SPIRONOLACTONE 50 MG PO TABS
50.0000 mg | ORAL_TABLET | Freq: Every day | ORAL | 1 refills | Status: DC
Start: 2022-09-28 — End: 2023-04-05

## 2022-09-28 MED ORDER — BUPROPION HCL ER (XL) 150 MG PO TB24
150.0000 mg | ORAL_TABLET | Freq: Every day | ORAL | 1 refills | Status: DC
Start: 2022-09-28 — End: 2023-05-19

## 2022-09-28 NOTE — Progress Notes (Signed)
Patient ID: Theresa Fitzgerald, female  DOB: 1971-08-29, 51 y.o.   MRN: [id number] Patient Care Team    Relationship Specialty Notifications Start End  Ma Hillock, DO PCP - General Family Medicine  09/01/21   Shelly Bombard, MD Consulting Physician Obstetrics and Gynecology  09/01/21     Chief Complaint  Patient presents with   Anxiety    Wants to discuss starting meds    Subjective: Theresa Fitzgerald is a 51 y.o.  Female  present for [facility] All past medical history, surgical history, allergies, family history, immunizations, medications and social history were updated in the electronic medical record today. All recent labs, ED visits and hospitalizations within the last year were reviewed. Anxiety: Patient reports she is feeling overwhelmed.  She has new job duties.  Her mother's health has been declining and she helps with her caregiving.  She feels like she just cannot focus on her tasks.  She had been on Lexapro in the past, but felt it caused her to gain weight.  Acne: Pt endorses cystic acne history. She has been tried on many face washes and gels over the years.  She has been prescribed spirolactone 50 mg qd for about 8 yrs. She reports her acne is well controlled on spiro medication.  She had seen dermatology for this medication in the past. Her dermatologist is no longer practicing (cosmetics only).       05/08/2022   10:55 AM 12/17/2021    3:51 PM 12/17/2021    3:27 PM 09/01/2021    3:06 PM  Depression screen PHQ 2/9  Decreased Interest 0 0 0 0  Down, Depressed, Hopeless 0 0 0 0  PHQ - 2 Score 0 0 0 0  Altered sleeping 0 0    Tired, decreased energy 0 0    Change in appetite 0 0    Feeling bad or failure about yourself  0 0    Trouble concentrating 0 0    Moving slowly or fidgety/restless 0 0    Suicidal thoughts 0 0    PHQ-9 Score 0 0    Difficult doing work/chores  Not difficult at all        05/08/2022   10:55 AM 12/17/2021    3:52 PM   GAD 7 : Generalized Anxiety Score  Nervous, Anxious, on Edge 0 1  Control/stop worrying 0 1  Worry too much - different things 0 1  Trouble relaxing 0 1  Restless 0 1  Easily annoyed or irritable 0 1  Afraid - awful might happen 0 0  Total GAD 7 Score 0 6  Anxiety Difficulty  Somewhat difficult     Immunization History  Administered Date(s) Administered   PFIZER(Purple Top)SARS-COV-2 Vaccination 01/29/2020, 02/26/2020   Past Medical History:  Diagnosis Date   Acne    Dysmenorrhea 01/31/2016   UTI (urinary tract infection)    Vegan diet 2002   No Known Allergies Past Surgical History:  Procedure Laterality Date   WISDOM TOOTH EXTRACTION     Family History  Problem Relation Age of Onset   Stroke Mother    Hypertension Mother    Hyperlipidemia Mother    Hearing loss Mother    Polymyalgia rheumatica Mother        Giant cell arteritis.   Social History   Social History Narrative   Marital status/children/pets: Divorced. 1 child.    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 If you have any questions, ask your nurse or doctor.          buPROPion 150 MG 24 hr tablet Commonly known as: WELLBUTRIN XL Take 1 tablet (150 mg total) by mouth daily. Started by: Felix Pacini, DO   spironolactone 50 MG tablet Commonly known as: ALDACTONE Take 1 tablet (50 mg total) by mouth daily.   Sprintec 28 0.25-35 MG-MCG tablet Generic drug: norgestimate-ethinyl estradiol TAKE 1 TABLET BY MOUTH EVERY DAY        All past medical history, surgical history, allergies, family history, immunizations andmedications were updated in the EMR today and reviewed under the history and medication portions of their EMR.     No  results found for this or any previous visit (from the past 2160 hour(s)).  ROS 14 pt review of systems performed and negative (unless mentioned in an HPI)  Objective: BP 123/85   Pulse 73   Temp 98.2 F (36.8 C)   Wt 121 lb 6.4 oz (55.1 kg)   SpO2 100%   BMI 20.84 kg/m  Physical Exam Vitals and nursing note reviewed.  Constitutional:      General: She is not in acute distress.    Appearance: Normal appearance. She is normal weight. She is not ill-appearing or toxic-appearing.  Eyes:     Extraocular Movements: Extraocular movements intact.     Conjunctiva/sclera: Conjunctivae normal.     Pupils: Pupils are equal, round, and reactive to light.  Skin:    Findings: No lesion or rash.  Neurological:     Mental Status: She is alert and oriented to person, place, and time. Mental status is at baseline.  Psychiatric:        Mood and Affect: Mood normal.        Behavior: Behavior normal.        Thought Content: Thought content normal.        Judgment: Judgment normal.     No results found.  Assessment/plan: Theresa Fitzgerald is a 51 y.o. female present for CPE Acne vulgaris/hair loss Stable Continue spiro 50 mg qd (> 9yrs) for acne.  F/u 5.5 mos.   Anxiety: Discussed different options with her today for medication.  Elected to try Wellbutrin 150 mg daily, which hopefully will also help with her focus as well as anxiety  No orders of the defined types were placed in this encounter.  Meds ordered this encounter  Medications   spironolactone (ALDACTONE) 50 MG tablet    Sig: Take 1 tablet (50 mg total) by mouth daily.    Dispense:  90 tablet    Refill:  1   buPROPion (WELLBUTRIN XL) 150 MG 24 hr tablet    Sig: Take 1 tablet (150 mg total) by mouth daily.    Dispense:  90 tablet    Refill:  1   Referral Orders  No referral(s) requested today     Electronically signed by: Felix Pacini, DO [facility] [place]

## 2022-09-28 NOTE — Patient Instructions (Signed)
No follow-ups on file.        Great to see you today.  I have refilled the medication(s) we provide.   If labs were collected, we will inform you of lab results once received either by echart message or telephone call.   - echart message- for normal results that have been seen by the patient already.   - telephone call: abnormal results or if patient has not viewed results in their echart.  

## 2022-12-13 ENCOUNTER — Other Ambulatory Visit: Payer: Self-pay | Admitting: Obstetrics

## 2023-04-05 ENCOUNTER — Other Ambulatory Visit: Payer: Self-pay | Admitting: Family Medicine

## 2023-04-05 ENCOUNTER — Telehealth: Payer: Self-pay

## 2023-04-05 MED ORDER — SPIRONOLACTONE 50 MG PO TABS: 50.0000 mg | ORAL_TABLET | Freq: Every day | ORAL | 0 refills | Status: DC

## 2023-04-05 NOTE — Telephone Encounter (Signed)
Patient refill request.  [facility] [place]  spironolactone (ALDACTONE) 50 MG tablet

## 2023-04-06 ENCOUNTER — Other Ambulatory Visit: Payer: Self-pay | Admitting: Family Medicine

## 2023-04-13 ENCOUNTER — Encounter: Payer: Self-pay | Admitting: Family Medicine

## 2023-04-13 ENCOUNTER — Ambulatory Visit: Payer: BC Managed Care – PPO | Admitting: Family Medicine

## 2023-04-13 VITALS — BP 135/80 | HR 78 | Temp 98.1°F | Wt 121.8 lb

## 2023-04-13 DIAGNOSIS — W57XXXA Bitten or stung by nonvenomous insect and other nonvenomous arthropods, initial encounter: Secondary | ICD-10-CM

## 2023-04-13 DIAGNOSIS — R591 Generalized enlarged lymph nodes: Secondary | ICD-10-CM | POA: Diagnosis not present

## 2023-04-13 DIAGNOSIS — S0006XA Insect bite (nonvenomous) of scalp, initial encounter: Secondary | ICD-10-CM

## 2023-04-13 MED ORDER — DOXYCYCLINE HYCLATE 100 MG PO TABS: 100.0000 mg | ORAL_TABLET | Freq: Two times a day (BID) | ORAL | 0 refills | Status: DC

## 2023-04-13 MED ORDER — SPIRONOLACTONE 50 MG PO TABS: 50.0000 mg | ORAL_TABLET | Freq: Every day | ORAL | 0 refills | Status: DC

## 2023-04-13 NOTE — Progress Notes (Signed)
Theresa Fitzgerald , October 18, 1971, 52 y.o., female MRN: [id number] Patient Care Team    Relationship Specialty Notifications Start End  Natalia Leatherwood, DO PCP - General Family Medicine  09/01/21   Brock Bad, MD Consulting Physician Obstetrics and Gynecology  09/01/21     Chief Complaint  Patient presents with   Mass    Right back side of neck; 3-4 weeks; denies any pain and hasn't noticed any growth.     Subjective: Theresa Fitzgerald is a 52 y.o. Pt presents for an OV with complaints of "knot on neck"  of 3 weeks duration.  Associated symptoms include no pain and area does not seem to be enlarging or improving in size. She denies fever, chills, rash, headache, joint pain or ear pain. She does recall a tick bite on her scalp mid April. She states it was attached for unknown length of time.      04/13/2023    8:18 AM 09/28/2022    4:10 PM 05/08/2022   10:55 AM 12/17/2021    3:51 PM 12/17/2021    3:27 PM  Depression screen PHQ 2/9  Decreased Interest 0 1 0 0 0  Down, Depressed, Hopeless 0 1 0 0 0  PHQ - 2 Score 0 2 0 0 0  Altered sleeping  0 0 0   Tired, decreased energy  2 0 0   Change in appetite  0 0 0   Feeling bad or failure about yourself   1 0 0   Trouble concentrating  3 0 0   Moving slowly or fidgety/restless  0 0 0   Suicidal thoughts  0 0 0   PHQ-9 Score  8 0 0   Difficult doing work/chores    Not difficult at all     No Known Allergies Social History   Social History Narrative   Marital status/children/pets: Divorced. 1 child.    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[employer]   Safety:      -smoke alarm in the home:Yes     - wears seatbelt: Yes     - Feels safe in their relationships: Yes      Past Medical History:  Diagnosis Date   Acne    Dysmenorrhea 01/31/2016   UTI (urinary tract infection)    Vegan diet 2002   Past Surgical History:  Procedure Laterality Date   WISDOM TOOTH EXTRACTION     Family History  Problem  Relation Age of Onset   Stroke Mother    Hypertension Mother    Hyperlipidemia Mother    Hearing loss Mother    Polymyalgia rheumatica Mother        Giant cell arteritis.   Allergies as of 04/13/2023   No Known Allergies      Medication List        Accurate as of Apr 13, 2023  8:29 AM. If you have any questions, ask your nurse or doctor.          buPROPion 150 MG 24 hr tablet Commonly known as: WELLBUTRIN XL Take 1 tablet (150 mg total) by mouth daily.   doxycycline 100 MG tablet Commonly known as: VIBRA-TABS Take 1 tablet (100 mg total) by mouth 2 (two) times daily. Started by: Felix Pacini, DO   norgestimate-ethinyl estradiol 0.25-35 MG-MCG tablet Commonly known as: ORTHO-CYCLEN TAKE 1 TABLET BY MOUTH EVERY DAY   spironolactone 50 MG tablet Commonly known as: ALDACTONE Take 1 tablet (50 mg total) by mouth daily.  OFFICE VISIT NEEDED FOR FURTHER REFILLS        All past medical history, surgical history, allergies, family history, immunizations andmedications were updated in the EMR today and reviewed under the history and medication portions of their EMR.     ROS Negative, with the exception of above mentioned in HPI   Objective:  BP 135/80   Pulse 78   Temp 98.1 F (36.7 C)   Wt 121 lb 12.8 oz (55.2 kg)   SpO2 100%   BMI 20.91 kg/m  Body mass index is 20.91 kg/m. Physical Exam Vitals and nursing note reviewed.  Constitutional:      General: She is not in acute distress.    Appearance: Normal appearance. She is normal weight. She is not ill-appearing or toxic-appearing.  HENT:     Head: Normocephalic and atraumatic.     Comments: Scalp without erythema, swelling or lesions.  Eyes:     General: No scleral icterus.       Right eye: No discharge.        Left eye: No discharge.     Extraocular Movements: Extraocular movements intact.     Conjunctiva/sclera: Conjunctivae normal.     Pupils: Pupils are equal, round, and reactive to light.  Neck:      Comments: Right subocc posterior neck enlarged firm mobile nontender lymph node Musculoskeletal:     Cervical back: Neck supple. No tenderness.  Skin:    Findings: No lesion or rash.  Neurological:     Mental Status: She is alert and oriented to person, place, and time. Mental status is at baseline.     Motor: No weakness.     Coordination: Coordination normal.     Gait: Gait normal.  Psychiatric:        Mood and Affect: Mood normal.        Behavior: Behavior normal.        Thought Content: Thought content normal.        Judgment: Judgment normal.      No results found. No results found. No results found for this or any previous visit (from the past 24 hour(s)).  Assessment/Plan: Theresa Fitzgerald is a 52 y.o. female present for OV for  Lymphadenopathy/Tick bite of scalp, initial encounter Tick bite on crown of scalp- attached > 24 hours about 4 weeks ago with lymphadenopathy right suboccipital/neck region x1 .  - B. burgdorfi antibodies Doxy BID x 10 d If + titer extend to 21 days tx.  F/u prn  Reviewed expectations re: course of current medical issues. Discussed self-management of symptoms. Outlined signs and symptoms indicating need for more acute intervention. Patient verbalized understanding and all questions were answered. Patient received an After-Visit Summary.    Orders Placed This Encounter  Procedures   B. burgdorfi antibodies   Meds ordered this encounter  Medications   doxycycline (VIBRA-TABS) 100 MG tablet    Sig: Take 1 tablet (100 mg total) by mouth 2 (two) times daily.    Dispense:  20 tablet    Refill:  0   spironolactone (ALDACTONE) 50 MG tablet    Sig: Take 1 tablet (50 mg total) by mouth daily. OFFICE VISIT NEEDED FOR FURTHER REFILLS    Dispense:  30 tablet    Refill:  0    OFFICE VISIT NEEDED FOR FURTHER REFILLS   Referral Orders  No referral(s) requested today     Note is dictated utilizing voice recognition software.  Although note has been proof  read prior to signing, occasional typographical errors still can be missed. If any questions arise, please do not hesitate to call for verification.   electronically signed by:  Howard Pouch, DO  [place]

## 2023-04-14 LAB — B. BURGDORFI ANTIBODIES: B burgdorferi Ab IgG+IgM: <0.9 index

## 2023-04-15 ENCOUNTER — Telehealth: Payer: Self-pay | Admitting: Family Medicine

## 2023-04-15 NOTE — Telephone Encounter (Signed)
Lyme titers are negative. Complete doxycycline course provided.

## 2023-04-20 ENCOUNTER — Encounter: Payer: Self-pay | Admitting: Family Medicine

## 2023-04-28 ENCOUNTER — Encounter: Payer: Self-pay | Admitting: Family Medicine

## 2023-04-28 NOTE — Telephone Encounter (Signed)
No further action needed previous message sent to PCP

## 2023-04-30 ENCOUNTER — Encounter: Payer: Self-pay | Admitting: Family Medicine

## 2023-04-30 ENCOUNTER — Telehealth: Payer: BC Managed Care – PPO | Admitting: Family Medicine

## 2023-04-30 VITALS — Ht 64.0 in | Wt 121.0 lb

## 2023-04-30 DIAGNOSIS — R278 Other lack of coordination: Secondary | ICD-10-CM | POA: Diagnosis not present

## 2023-04-30 DIAGNOSIS — R29898 Other symptoms and signs involving the musculoskeletal system: Secondary | ICD-10-CM

## 2023-04-30 DIAGNOSIS — R251 Tremor, unspecified: Secondary | ICD-10-CM

## 2023-04-30 NOTE — Progress Notes (Signed)
Virtual Visit via Video Note  I connected with Theresa Fitzgerald  on 04/30/23 at 11:00 AM EDT by a video enabled telemedicine application and verified that I am speaking with the correct person using two identifiers.  Location patient: [place] Location provider:work or home office Persons participating in the virtual visit: patient, provider  I discussed the limitations and requested verbal permission for telemedicine visit. The patient expressed understanding and agreed to proceed.   HPI: 52 year old female being seen today for tremors. On 5/19 patient noted onset of tremulousness in right hand/arm.  Left arm started doing the same not long after.  Sometimes simply feels tremulous but when she looks at hands they are not trembling.  Feels like dexterity and arms and hands is decreased.  She feels like she has some stiffness/possible cogwheeling of wrists and elbows and shoulders. She thought maybe this was to do with her Wellbutrin so she stopped it (she has been on this medication for about half a year now without problem) symptoms have actually progressed instead of improved.  Denies focal weakness but says both arms feel a bit tired towards the end of the day.  Denies headaches, dizziness, paresthesias, or unsteady gait. No significant rebound of anxiety since being off Wellbutrin.  The only other medications that she has been on are Ortho-Cyclen and Aldactone, both of which she has been on for years.   ROS as above, plus--> no fevers, No myalgias or arthralgias.  No acute vision or hearing abnormalities.  No difficulty swallowing or speaking.  No confusion or memory problems.  Past Medical History:  Diagnosis Date   Acne    Dysmenorrhea 01/31/2016   UTI (urinary tract infection)    Vegan diet 2002    Past Surgical History:  Procedure Laterality Date   WISDOM TOOTH EXTRACTION     Family history: No known tremors or neurodegenerative disorder Mother has temporal arteritis  and polymyalgia rheumatica.   Current Outpatient Medications:    norgestimate-ethinyl estradiol (ORTHO-CYCLEN) 0.25-35 MG-MCG tablet, TAKE 1 TABLET BY MOUTH EVERY DAY, Disp: 84 tablet, Rfl: 4   spironolactone (ALDACTONE) 50 MG tablet, Take 1 tablet (50 mg total) by mouth daily. OFFICE VISIT NEEDED FOR FURTHER REFILLS, Disp: 30 tablet, Rfl: 0   buPROPion (WELLBUTRIN XL) 150 MG 24 hr tablet, Take 1 tablet (150 mg total) by mouth daily. (Patient not taking: Reported on 04/30/2023), Disp: 90 tablet, Rfl: 1   doxycycline (VIBRA-TABS) 100 MG tablet, Take 1 tablet (100 mg total) by mouth 2 (two) times daily. (Patient not taking: Reported on 04/30/2023), Disp: 20 tablet, Rfl: 0  EXAM:  VITALS per patient if applicable:     04/30/2023   11:00 AM 04/13/2023    8:11 AM 09/28/2022    3:22 PM  Vitals with BMI  Height 5\' 4"     Weight 121 lbs 121 lbs 13 oz 121 lbs 6 oz  BMI 20.76    Systolic  135 123  Diastolic  80 85  Pulse  78 73     GENERAL: alert, oriented, appears well and in no acute distress  HEENT: atraumatic, conjunttiva clear, no obvious abnormalities on inspection of external nose and ears  NECK: normal movements of the head and neck  LUNGS: on inspection no signs of respiratory distress, breathing rate appears normal, no obvious gross SOB, gasping or wheezing  CV: no obvious cyanosis  MS: moves all visible extremities without noticeable abnormality  PSYCH/NEURO: No detectable tremor. pleasant and cooperative, no obvious depression or anxiety,  speech and thought processing grossly intact  LABS: none today    Chemistry      Component Value Date/Time   NA 138 12/17/2021 1550   K 4.2 12/17/2021 1550   CL 102 12/17/2021 1550   CO2 29 12/17/2021 1550   BUN 12 12/17/2021 1550   CREATININE 0.66 12/17/2021 1550      Component Value Date/Time   CALCIUM 8.9 12/17/2021 1550   AST 17 12/17/2021 1550   ALT 16 12/17/2021 1550   BILITOT 0.4 12/17/2021 1550     Lab Results   Component Value Date   WBC 6.4 12/17/2021   HGB 13.1 12/17/2021   HCT 38.4 12/17/2021   MCV 89.5 12/17/2021   PLT 265 12/17/2021   Lab Results  Component Value Date   HGBA1C 4.5 12/17/2021   ASSESSMENT AND PLAN:  Discussed the following assessment and plan:  Acute onset tremors in hands, decreased dexterity in hands, and question of cogwheeling rigidity. All of this is of unclear etiology, but I do not think it has anything to do with her medications. Will stay off Wellbutrin, though, because she has not had problems with anxiety since stopping it. Neurology evaluation needed--referral ordered today. No neuroimaging ordered today.  I emphasized the importance of coming in for a neurologic exam with her PCP (or me if PCP unavailable) as soon as possible.  I discussed the assessment and treatment plan with the patient. The patient was provided an opportunity to ask questions and all were answered. The patient agreed with the plan and demonstrated an understanding of the instructions.   Signed:  Santiago Bumpers, MD           04/30/2023

## 2023-05-03 NOTE — Telephone Encounter (Signed)
Appears patient was seen by another provider 04/30/2023

## 2023-05-10 ENCOUNTER — Telehealth: Payer: Self-pay

## 2023-05-10 ENCOUNTER — Encounter: Payer: Self-pay | Admitting: Family Medicine

## 2023-05-10 DIAGNOSIS — R251 Tremor, unspecified: Secondary | ICD-10-CM

## 2023-05-10 NOTE — Addendum Note (Signed)
Addended by: Jeoffrey Massed on: 05/10/2023 04:49 PM   Modules accepted: Orders

## 2023-05-10 NOTE — Telephone Encounter (Signed)
Hi Dr Milinda Cave, I saw you for tremors and you referred me to a neurologist but I cannot see them until September. I did schedule an appointment with you on the 19th since our previous appointment had been virtual. Of course, I am worried and have done some research. I saw that B12 deficiency can have some similar symptoms and I  have been vegan for years. Is this worth doing a blood test? If so, could I come by prior to our appointment and have that done? Thank you for your time.  Please advise if pt needs to see PCP

## 2023-05-10 NOTE — Telephone Encounter (Signed)
Vitamin B12 level ordered.  Also keep appointment with me on the 19th.

## 2023-05-11 NOTE — Telephone Encounter (Signed)
Duplicate message, please see original MyChart message sent from patient. Pt advised of recommendations.

## 2023-05-12 ENCOUNTER — Other Ambulatory Visit (INDEPENDENT_AMBULATORY_CARE_PROVIDER_SITE_OTHER): Payer: BC Managed Care – PPO

## 2023-05-12 DIAGNOSIS — R251 Tremor, unspecified: Secondary | ICD-10-CM | POA: Diagnosis not present

## 2023-05-12 NOTE — Progress Notes (Signed)
Pt came for labs, tolerated draw well.

## 2023-05-13 LAB — VITAMIN B12: Vitamin B-12: 260 pg/mL (ref 211–911)

## 2023-05-19 ENCOUNTER — Encounter: Payer: Self-pay | Admitting: Family Medicine

## 2023-05-19 ENCOUNTER — Ambulatory Visit: Payer: BC Managed Care – PPO | Admitting: Family Medicine

## 2023-05-19 VITALS — BP 120/77 | HR 79 | Wt 120.4 lb

## 2023-05-19 DIAGNOSIS — R29898 Other symptoms and signs involving the musculoskeletal system: Secondary | ICD-10-CM

## 2023-05-19 DIAGNOSIS — R251 Tremor, unspecified: Secondary | ICD-10-CM

## 2023-05-19 NOTE — Progress Notes (Signed)
OFFICE VISIT  05/19/2023  CC:  Chief Complaint  Patient presents with   Follow-up    Neuro Exam. No other questions or concerns.    Patient is a 52 y.o. female who presents for follow-up from 04/30/2023 virtual visit for tremor of unknown etiology. A/P as of last visit: "Acute onset tremors in hands, decreased dexterity in hands, and question of cogwheeling rigidity. All of this is of unclear etiology, but I do not think it has anything to do with her medications. Will stay off Wellbutrin, though, because she has not had problems with anxiety since stopping it. Neurology evaluation needed--referral ordered today. No neuroimaging ordered today.  I emphasized the importance of coming in for a neurologic exam with her PCP (or me if PCP unavailable) as soon as possible."  INTERIM HX: We checked vitamin B12 since last visit and it was in the lower end of normal so we started oral B12 supplement. She has an appointment to establish care with Dr. Despina Arias in neurology on 08/09/2023.  From a symptom standpoint nothing has changed, in fact possibly they are bothering her even more over the last couple weeks.  Definitely waxes and wanes--the most prominent things that she notices still are the feeling of her hands trembling and a sense that her wrists and elbows are moving with a ratcheting sensation and motion. This affects her sense of dexterity.  However, she had writes as per normal and denies any weakness in the hands.  She feels like the symptoms are symmetric.  No similar symptoms in the legs.  No memory or cognitive impairment.  No vision or hearing abnormalities, no headaches, no dizziness, no vertigo, no falls, no focal weakness, no paresthesias.  ROS as above, plus--> no fevers, no CP, no SOB, no wheezing, no cough, no o rashes, no melena/hematochezia.  No polyuria or polydipsia.  No myalgias or arthralgias.   No dysuria or unusual/new urinary urgency or frequency.  No recent changes in  lower legs. No n/v/d or abd pain.  No palpitations.    Past Medical History:  Diagnosis Date   Acne    Dysmenorrhea 01/31/2016   UTI (urinary tract infection)    Vegan diet 2002    Past Surgical History:  Procedure Laterality Date   WISDOM TOOTH EXTRACTION      Outpatient Medications Prior to Visit  Medication Sig Dispense Refill   norgestimate-ethinyl estradiol (ORTHO-CYCLEN) 0.25-35 MG-MCG tablet TAKE 1 TABLET BY MOUTH EVERY DAY 84 tablet 4   spironolactone (ALDACTONE) 50 MG tablet Take 1 tablet (50 mg total) by mouth daily. OFFICE VISIT NEEDED FOR FURTHER REFILLS 30 tablet 0   buPROPion (WELLBUTRIN XL) 150 MG 24 hr tablet Take 1 tablet (150 mg total) by mouth daily. (Patient not taking: Reported on 04/30/2023) 90 tablet 1   doxycycline (VIBRA-TABS) 100 MG tablet Take 1 tablet (100 mg total) by mouth 2 (two) times daily. (Patient not taking: Reported on 04/30/2023) 20 tablet 0   No facility-administered medications prior to visit.    No Known Allergies  Review of Systems As per HPI  PE:    05/19/2023    1:01 PM 04/30/2023   11:00 AM 04/13/2023    8:11 AM  Vitals with BMI  Height  5\' 4"    Weight 120 lbs 6 oz 121 lbs 121 lbs 13 oz  BMI 20.66 20.76   Systolic 120  135  Diastolic 77  80  Pulse 79  78     Physical Exam  Gen: Alert, well appearing.  Patient is oriented to person, place, time, and situation. AFFECT: pleasant, lucid thought and speech. Neuro: CN 2-12 intact bilaterally, strength 5/5 in proximal and distal upper extremities and lower extremities bilaterally.  No sensory deficits.  No tremor.  No disdiadochokinesis.  No ataxia.  Upper extremity and lower extremity DTRs symmetric.  No pronator drift.   LABS:  Last CBC Lab Results  Component Value Date   WBC 6.4 12/17/2021   HGB 13.1 12/17/2021   HCT 38.4 12/17/2021   MCV 89.5 12/17/2021   MCH 30.5 12/17/2021   RDW 11.8 12/17/2021   PLT 265 12/17/2021   Last metabolic panel Lab Results  Component  Value Date   GLUCOSE 77 12/17/2021   NA 138 12/17/2021   K 4.2 12/17/2021   CL 102 12/17/2021   CO2 29 12/17/2021   BUN 12 12/17/2021   CREATININE 0.66 12/17/2021   CALCIUM 8.9 12/17/2021   PROT 6.5 12/17/2021   BILITOT 0.4 12/17/2021   AST 17 12/17/2021   ALT 16 12/17/2021   Last vitamin B12 and Folate Lab Results  Component Value Date   VITAMINB12 260 05/12/2023   IMPRESSION AND PLAN:  #1 arms/hands tremor and rigidity, unknown etiology, unknown prognosis. Relatively acute onset, waxing and waning course, symmetric. Neurologic exam entirely normal today. MRI brain w and w/out contrast ordered---r/o AVM, mass, demyelination,   #2 vitamin B12 deficiency. I do not think her current symptoms are due to this. Nevertheless, we have just started her on oral vitamin B12 supplement and plan to repeat this level in approximately 4 weeks.  An After Visit Summary was printed and given to the patient.  FOLLOW UP: Return in about 4 weeks (around 06/16/2023) for f/u tremors and rigidity as well as check B12 level.  Signed:  Santiago Bumpers, MD           05/19/2023

## 2023-05-27 ENCOUNTER — Encounter: Payer: BC Managed Care – PPO | Admitting: Family Medicine

## 2023-06-08 ENCOUNTER — Ambulatory Visit
Admission: RE | Admit: 2023-06-08 | Discharge: 2023-06-08 | Disposition: A | Discharge status: Home / Self Care | Payer: BC Managed Care – PPO | Source: Ambulatory Visit | Attending: Family Medicine | Admitting: Family Medicine

## 2023-06-08 DIAGNOSIS — R29898 Other symptoms and signs involving the musculoskeletal system: Secondary | ICD-10-CM

## 2023-06-08 DIAGNOSIS — R251 Tremor, unspecified: Secondary | ICD-10-CM

## 2023-06-08 MED ORDER — GADOPICLENOL 0.5 MMOL/ML IV SOLN
6.0000 mL | Freq: Once | INTRAVENOUS | Status: AC | PRN
  Administered 2023-06-08: 6 mL via INTRAVENOUS

## 2023-06-10 ENCOUNTER — Other Ambulatory Visit: Payer: Self-pay | Admitting: Family Medicine

## 2023-06-15 ENCOUNTER — Ambulatory Visit (INDEPENDENT_AMBULATORY_CARE_PROVIDER_SITE_OTHER): Payer: BC Managed Care – PPO | Admitting: Family Medicine

## 2023-06-15 ENCOUNTER — Encounter: Payer: Self-pay | Admitting: Family Medicine

## 2023-06-15 VITALS — BP 112/74 | HR 64 | Temp 98.3°F | Ht 64.0 in | Wt 120.8 lb

## 2023-06-15 DIAGNOSIS — Z1231 Encounter for screening mammogram for malignant neoplasm of breast: Secondary | ICD-10-CM

## 2023-06-15 DIAGNOSIS — Z131 Encounter for screening for diabetes mellitus: Secondary | ICD-10-CM

## 2023-06-15 DIAGNOSIS — L700 Acne vulgaris: Secondary | ICD-10-CM | POA: Diagnosis not present

## 2023-06-15 DIAGNOSIS — Z1211 Encounter for screening for malignant neoplasm of colon: Secondary | ICD-10-CM

## 2023-06-15 DIAGNOSIS — Z13220 Encounter for screening for lipoid disorders: Secondary | ICD-10-CM | POA: Diagnosis not present

## 2023-06-15 DIAGNOSIS — Z823 Family history of stroke: Secondary | ICD-10-CM | POA: Diagnosis not present

## 2023-06-15 DIAGNOSIS — Z2821 Immunization not carried out because of patient refusal: Secondary | ICD-10-CM | POA: Diagnosis not present

## 2023-06-15 DIAGNOSIS — Z0000 Encounter for general adult medical examination without abnormal findings: Secondary | ICD-10-CM

## 2023-06-15 DIAGNOSIS — E538 Deficiency of other specified B group vitamins: Secondary | ICD-10-CM | POA: Diagnosis not present

## 2023-06-15 LAB — CBC WITH DIFFERENTIAL/PLATELET
Basophils Absolute: 0 10*3/uL (ref 0.0–0.1)
Basophils Relative: 0.6 % (ref 0.0–3.0)
Eosinophils Absolute: 0.1 10*3/uL (ref 0.0–0.7)
Eosinophils Relative: 1.4 % (ref 0.0–5.0)
HCT: 39.1 % (ref 36.0–46.0)
Hemoglobin: 12.9 g/dL (ref 12.0–15.0)
Lymphocytes Relative: 49.8 % — ABNORMAL HIGH (ref 12.0–46.0)
Lymphs Abs: 3 10*3/uL (ref 0.7–4.0)
MCHC: 32.9 g/dL (ref 30.0–36.0)
MCV: 91.7 fl (ref 78.0–100.0)
Monocytes Absolute: 0.2 10*3/uL (ref 0.1–1.0)
Monocytes Relative: 3.9 % (ref 3.0–12.0)
Neutro Abs: 2.7 10*3/uL (ref 1.4–7.7)
Neutrophils Relative %: 44.3 % (ref 43.0–77.0)
Platelets: 231 10*3/uL (ref 150.0–400.0)
RBC: 4.26 Mil/uL (ref 3.87–5.11)
RDW: 12.9 % (ref 11.5–15.5)
WBC: 6 10*3/uL (ref 4.0–10.5)

## 2023-06-15 LAB — LIPID PANEL
Cholesterol: 219 mg/dL — ABNORMAL HIGH (ref 0–200)
HDL: 71.6 mg/dL (ref >39.00)
LDL Cholesterol: 127 mg/dL — ABNORMAL HIGH (ref 0–99)
NonHDL: 147.62
Total CHOL/HDL Ratio: 3
Triglycerides: 101 mg/dL (ref 0.0–149.0)
VLDL: 20.2 mg/dL (ref 0.0–40.0)

## 2023-06-15 LAB — COMPREHENSIVE METABOLIC PANEL WITH GFR
ALT: 17 U/L (ref 0–35)
AST: 19 U/L (ref 0–37)
Albumin: 4 g/dL (ref 3.5–5.2)
Alkaline Phosphatase: 87 U/L (ref 39–117)
BUN: 7 mg/dL (ref 6–23)
CO2: 29 meq/L (ref 19–32)
Calcium: 9.3 mg/dL (ref 8.4–10.5)
Chloride: 102 meq/L (ref 96–112)
Creatinine, Ser: 0.68 mg/dL (ref 0.40–1.20)
GFR: 102.16 mL/min (ref >60.00)
Glucose, Bld: 88 mg/dL (ref 70–99)
Potassium: 4.1 meq/L (ref 3.5–5.1)
Sodium: 137 meq/L (ref 135–145)
Total Bilirubin: 0.6 mg/dL (ref 0.2–1.2)
Total Protein: 6.4 g/dL (ref 6.0–8.3)

## 2023-06-15 LAB — HEMOGLOBIN A1C: Hgb A1c MFr Bld: 4.8 % (ref 4.6–6.5)

## 2023-06-15 LAB — TSH: TSH: 1.33 u[IU]/mL (ref 0.35–5.50)

## 2023-06-15 MED ORDER — SPIRONOLACTONE 50 MG PO TABS: 50.0000 mg | ORAL_TABLET | Freq: Every day | ORAL | 3 refills | Status: DC

## 2023-06-15 NOTE — Patient Instructions (Addendum)
Return in about 1 year (around 06/15/2024) for cpe (20 min), Routine chronic condition follow-up.        Great to see you today.  I have refilled the medication(s) we provide.   If labs were collected, we will inform you of lab results once received either by echart message or telephone call.   - echart message- for normal results that have been seen by the patient already.   - telephone call: abnormal results or if patient has not viewed results in their echart.

## 2023-06-15 NOTE — Progress Notes (Signed)
Patient ID: Theresa Fitzgerald, female  DOB: October 24, 1971, 52 y.o.   MRN: [id number] Patient Care Team    Relationship Specialty Notifications Start End  Natalia Leatherwood, DO PCP - General Family Medicine  09/01/21   Brock Bad, MD Consulting Physician Obstetrics and Gynecology  09/01/21     Chief Complaint  Patient presents with   Annual Exam    Pt is fasting     Subjective: Theresa Fitzgerald is a 52 y.o.  Female  present for CPE and Chronic Conditions/illness Management . All past medical history, surgical history, allergies, family history, immunizations, medications and social history were updated in the electronic medical record today. All recent labs, ED visits and hospitalizations within the last year were reviewed.  Health maintenance:  Colonoscopy: No fhx. Cologuard ordered last year. Did not complete-expired. Ordered again for her.  Mammogram: no prior. No fhx.  01/27/2022 at [facility] Completed > ordered Cervical cancer screening: last pap: 05/08/2022, (GYN- Dr. Clearance Coots) Immunizations: tdap declined, Influenza declined (encouraged yearly), covid declined Infectious disease screening: HIV and Hep C declined DEXA:routine screen 60 Assistive device: none Oxygen ION:GEXB Patient has a Dental home. Hospitalizations/ED visits: reviewed  Acne: Pt endorses cystic acne history. She has been tried on many face washes and gels over the years. She has been prescribed spirolactone 50 mg qd for about 2015. She reports her acne is well-controlled on spiro medication. She had seen dermatology for this medication in the past. Her dermatologist is no longer practicing (cosmetics only).       06/15/2023    9:02 AM 04/13/2023    8:18 AM 09/28/2022    4:10 PM 05/08/2022   10:55 AM 12/17/2021    3:51 PM  Depression screen PHQ 2/9  Decreased Interest 0 0 1 0 0  Down, Depressed, Hopeless 0 0 1 0 0  PHQ - 2 Score 0 0 2 0 0  Altered sleeping   0 0 0  Tired, decreased  energy   2 0 0  Change in appetite   0 0 0  Feeling bad or failure about yourself    1 0 0  Trouble concentrating   3 0 0  Moving slowly or fidgety/restless   0 0 0  Suicidal thoughts   0 0 0  PHQ-9 Score   8 0 0  Difficult doing work/chores     Not difficult at all      09/28/2022    4:10 PM 05/08/2022   10:55 AM 12/17/2021    3:52 PM  GAD 7 : Generalized Anxiety Score  Nervous, Anxious, on Edge 2 0 1  Control/stop worrying 2 0 1  Worry too much - different things 2 0 1  Trouble relaxing 2 0 1  Restless 0 0 1  Easily annoyed or irritable 2 0 1  Afraid - awful might happen 1 0 0  Total GAD 7 Score 11 0 6  Anxiety Difficulty   Somewhat difficult     Immunization History  Administered Date(s) Administered   PFIZER(Purple Top)SARS-COV-2 Vaccination 01/29/2020, 02/26/2020   Past Medical History:  Diagnosis Date   Acne    Dysmenorrhea 01/31/2016   UTI (urinary tract infection)    Vegan diet 2002   No Known Allergies Past Surgical History:  Procedure Laterality Date   WISDOM TOOTH EXTRACTION     Family History  Problem Relation Age of Onset   Stroke Mother    Hypertension Mother    Hyperlipidemia  Mother    Hearing loss Mother    Polymyalgia rheumatica Mother        Giant cell arteritis.   Social History   Social History Narrative   Marital status/children/pets: Divorced. 1 child.    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 If you have any questions, ask your nurse or doctor.          cyanocobalamin 1000 MCG tablet Commonly known as: VITAMIN B12 Take 1,000 mcg by mouth daily.   norgestimate-ethinyl estradiol 0.25-35 MG-MCG tablet Commonly known as: ORTHO-CYCLEN TAKE 1 TABLET BY MOUTH EVERY DAY    spironolactone 50 MG tablet Commonly known as: ALDACTONE Take 1 tablet (50 mg total) by mouth daily. What changed: additional instructions Changed by: Felix Pacini        All past medical history, surgical history, allergies, family history, immunizations andmedications were updated in the EMR today and reviewed under the history and medication portions of their EMR.        ROS 14 pt review of systems performed and negative (unless mentioned in an HPI)  Objective:  BP 112/74   Pulse 64   Temp 98.3 F (36.8 C)   Ht 5\' 4"  (1.626 m)   Wt 120 lb 12.8 oz (54.8 kg)   LMP 06/08/2023   SpO2 98%   BMI 20.74 kg/m  Physical Exam Vitals and nursing note reviewed.  Constitutional:      General: She is not in acute distress.    Appearance: Normal appearance. She is not ill-appearing or toxic-appearing.  HENT:     Head: Normocephalic and atraumatic.     Right Ear: Tympanic membrane, ear canal and external ear normal. There is no impacted cerumen.     Left Ear: Tympanic membrane, ear canal and external ear normal. There is no impacted cerumen.     Nose: No congestion or rhinorrhea.     Mouth/Throat:     Mouth: Mucous membranes are moist.     Pharynx: Oropharynx is clear. No oropharyngeal exudate or posterior oropharyngeal erythema.  Eyes:     General: No scleral icterus.       Right eye: No discharge.        Left eye: No discharge.     Extraocular Movements: Extraocular movements intact.     Conjunctiva/sclera: Conjunctivae normal.     Pupils: Pupils are equal, round, and reactive to light.  Cardiovascular:     Rate and Rhythm: Normal rate and regular rhythm.     Pulses: Normal pulses.     Heart sounds: Normal heart sounds. No murmur heard.    No friction rub. No gallop.  Pulmonary:     Effort: Pulmonary effort is normal. No respiratory distress.     Breath sounds: Normal breath sounds. No stridor. No wheezing, rhonchi or rales.  Chest:     Chest wall: No tenderness.   Abdominal:     General: Abdomen is flat. Bowel sounds are normal. There is no distension.     Palpations: Abdomen is soft. There is no mass.     Tenderness: There is no abdominal tenderness. There  is no right CVA tenderness, left CVA tenderness, guarding or rebound.     Hernia: No hernia is present.  Musculoskeletal:        General: No swelling, tenderness or deformity. Normal range of motion.     Cervical back: Normal range of motion and neck supple. No rigidity or tenderness.     Right lower leg: No edema.     Left lower leg: No edema.  Lymphadenopathy:     Cervical: No cervical adenopathy.  Skin:    General: Skin is warm and dry.     Coloration: Skin is not jaundiced or pale.     Findings: No bruising, erythema, lesion or rash.  Neurological:     General: No focal deficit present.     Mental Status: She is alert and oriented to person, place, and time. Mental status is at baseline.     Cranial Nerves: No cranial nerve deficit.     Sensory: No sensory deficit.     Motor: No weakness.     Coordination: Coordination normal.     Gait: Gait normal.     Deep Tendon Reflexes: Reflexes normal.  Psychiatric:        Mood and Affect: Mood normal.        Behavior: Behavior normal.        Thought Content: Thought content normal.        Judgment: Judgment normal.      No results found.  Assessment/plan: Theresa Fitzgerald is a 52 y.o. female present for CPE and Chronic Conditions/illness Management Acne vulgaris Stable Continue spiro 50 mg qd for acne.   B12 def: ~260 4-6 weeks ago w/ another provider. She has started supp. Subl.  B12 levels collected today Routine general medical examination at a health care facility Patient was encouraged to exercise greater than 150 minutes a week. Patient was encouraged to choose a diet filled with fresh fruits and vegetables, and lean meats. AVS provided to patient today for education/recommendation on gender specific health and  safety maintenance. Colonoscopy: No fhx. Cologuard ordered last year. Did not complete-expired. Ordered again for her.  Mammogram: no prior. No fhx.  01/27/2022 at [facility] Completed > ordered Cervical cancer screening: last pap: 05/08/2022, (GYN- Dr. Clearance Coots) Immunizations: tdap declined, Influenza declined (encouraged yearly), covid declined Infectious disease screening: HIV and Hep C declined DEXA:routine screen 60  Return in about 1 year (around 06/15/2024) for cpe (20 min), Routine chronic condition follow-up.   Orders Placed This Encounter  Procedures   MM 3D SCREENING MAMMOGRAM BILATERAL BREAST   CBC with Differential/Platelet   Comprehensive metabolic panel   Hemoglobin A1c   TSH   Lipid panel   Cologuard   B12   Meds ordered this encounter  Medications   spironolactone (ALDACTONE) 50 MG tablet    Sig: Take 1 tablet (50 mg total) by mouth daily.    Dispense:  90 tablet    Refill:  3   Referral Orders  No referral(s) requested today     Electronically signed by: Felix Pacini, DO  [facility] [place]

## 2023-06-16 ENCOUNTER — Encounter: Payer: Self-pay | Admitting: Family Medicine

## 2023-06-16 LAB — VITAMIN B12: Vitamin B-12: >1501 pg/mL — ABNORMAL HIGH (ref 211–911)

## 2023-06-17 NOTE — Telephone Encounter (Signed)
Lymphocytes are not abnormal overall. The absolute lymphocytes # are within normal range.

## 2023-06-22 ENCOUNTER — Encounter: Payer: Self-pay | Admitting: Neurology

## 2023-06-22 ENCOUNTER — Ambulatory Visit: Payer: BC Managed Care – PPO | Admitting: Neurology

## 2023-06-22 ENCOUNTER — Ambulatory Visit
Admission: RE | Admit: 2023-06-22 | Discharge: 2023-06-22 | Disposition: A | Discharge status: Home / Self Care | Payer: BC Managed Care – PPO | Source: Ambulatory Visit | Attending: Family Medicine | Admitting: Family Medicine

## 2023-06-22 VITALS — BP 111/72 | HR 73 | Ht 64.0 in | Wt 122.0 lb

## 2023-06-22 DIAGNOSIS — R251 Tremor, unspecified: Secondary | ICD-10-CM | POA: Diagnosis not present

## 2023-06-22 DIAGNOSIS — R292 Abnormal reflex: Secondary | ICD-10-CM

## 2023-06-22 DIAGNOSIS — M542 Cervicalgia: Secondary | ICD-10-CM | POA: Diagnosis not present

## 2023-06-22 DIAGNOSIS — Z1231 Encounter for screening mammogram for malignant neoplasm of breast: Secondary | ICD-10-CM

## 2023-06-22 DIAGNOSIS — E538 Deficiency of other specified B group vitamins: Secondary | ICD-10-CM | POA: Diagnosis not present

## 2023-06-22 NOTE — Progress Notes (Signed)
[facility]  PATIENT: Theresa Fitzgerald DOB: [birth year]  REFERRING DOCTOR OR PCP: Nicoletta Ba, MD; Felix Pacini DO SOURCE: Patient, notes from primary care, lab reports.  _________________________________   HISTORICAL  CHIEF COMPLAINT:  Chief Complaint  Patient presents with   New Patient (Initial Visit)    Pt in room 10. New patient here for tremors in both hands/arms  frequently started in May, pt said she noticed in both legs about 10 times in legs. Pt said it does come and go.     HISTORY OF PRESENT ILLNESS:  I had the pleasure of seeing a patient, Theresa Fitzgerald at [facility] for neurologic consultation regarding her tremors.  She is a 52 year old woman who began to note a tremor in the hands in May 2024.   Sometimes she would have the visible tremor in the hands but other times she would note a sensation of trembling without outward movement.  She would note the vibrating feeling mostly while sitting or watching TV.    She noted no issues wit her handwriting.   She notes no tremor at rest and oly somewhat notes the trembling sensation.   She notes a visble tremor more when she holds an item like her phone.   Her arms feel weak.  She notes this mostly with grasping or lifting.      She was noted to have a low vitamin B12 level (260).  After supplementation she felt that the symptoms improved though still occurring.  She has had some trembling in her legs and locks her knees to reduce.     She notes mild neck pain off/on that is old.   No change in her bladder function. Marland Kitchen  No change in voice.   Balance and stride are unchanged.      Data IMAGING I personally reviewed the MRI of the brain from 06/08/2023.  It is normal.  Laboratory test  show borderline reduced B12 of 260.  TSH was normal.  Hemoglobin A1c was normal    REVIEW OF SYSTEMS: Constitutional: No fevers, chills, sweats, or change in appetite Eyes: No  visual changes, double vision, eye pain Ear, nose and throat: No hearing loss, ear pain, nasal congestion, sore throat Cardiovascular: No chest pain, palpitations Respiratory:  No shortness of breath at rest or with exertion.   No wheezes GastrointestinaI: No nausea, vomiting, diarrhea, abdominal pain, fecal incontinence Genitourinary:  No dysuria, urinary retention or frequency.  No nocturia. Musculoskeletal:  No neck pain, back pain Integumentary: No rash, pruritus, skin lesions Neurological: as above Psychiatric: No depression at this time.  No anxiety Endocrine: No palpitations, diaphoresis, change in appetite, change in weigh or increased thirst Hematologic/Lymphatic:  No anemia, purpura, petechiae. Allergic/Immunologic: No itchy/runny eyes, nasal congestion, recent allergic reactions, rashes  ALLERGIES: No Known Allergies  HOME MEDICATIONS:  Current Outpatient Medications:    cyanocobalamin (VITAMIN B12) 1000 MCG tablet, Take 1,000 mcg by mouth daily., Disp: , Rfl:    norgestimate-ethinyl estradiol (ORTHO-CYCLEN) 0.25-35 MG-MCG tablet, TAKE 1 TABLET BY MOUTH EVERY DAY, Disp: 84 tablet, Rfl: 4   spironolactone (ALDACTONE) 50 MG tablet, Take 1 tablet (50 mg total) by mouth daily., Disp: 90 tablet, Rfl: 3  PAST MEDICAL HISTORY: Past Medical History:  Diagnosis Date   Acne    Dysmenorrhea 01/31/2016   UTI (urinary tract infection)    Vegan diet 2002    PAST SURGICAL HISTORY: Past Surgical History:  Procedure Laterality Date   WISDOM TOOTH  EXTRACTION      FAMILY HISTORY: Family History  Problem Relation Age of Onset   Stroke Mother    Hypertension Mother    Hyperlipidemia Mother    Hearing loss Mother    Polymyalgia rheumatica Mother        Giant cell arteritis.    SOCIAL HISTORY: Social History   Socioeconomic History   Marital status: Divorced    Spouse name: Not on file   Number of children: Not on file   Years of education: Not on file   Highest  education level: Not on file  Occupational History   Not on file  Tobacco Use   Smoking status: Never    Passive exposure: Never   Smokeless tobacco: Never  Vaping Use   Vaping status: Never Used  Substance and Sexual Activity   Alcohol use: No    Alcohol/week: 0.0 standard drinks of alcohol   Drug use: No   Sexual activity: Yes    Partners: Male    Birth control/protection: Pill  Other Topics Concern   Not on file  Social History Narrative   Marital status/children/pets: Divorced. 1 child.    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[employer]   Safety:      -smoke alarm in the home:Yes     - wears seatbelt: Yes     - Feels safe in their relationships: Yes         Right handed   Wears glasses    Drinks daily 1 cup   1 coke zero per day    Social Determinants of Health   Financial Resource Strain: Not on file  Food Insecurity: Not on file  Transportation Needs: Not on file  Physical Activity: Not on file  Stress: Not on file  Social Connections: Not on file  Intimate Partner Violence: Not on file       PHYSICAL EXAM  Vitals:   06/22/23 1544  BP: 111/72  Pulse: 73  Weight: 122 lb (55.3 kg)  Height: 5\' 4"  (1.626 m)    Body mass index is 20.94 kg/m.   General: The patient is well-developed and well-nourished and in no acute distress  HEENT:  Head is [place]/AT.  Sclera are anicteric.    Neck: No carotid bruits are noted.  The neck is nontender.  Cardiovascular: The heart has a regular rate and rhythm with a normal S1 and S2. There were no murmurs, gallops or rubs.    Skin: Extremities are without rash or  edema.  Musculoskeletal:  Back is nontender  Neurologic Exam  Mental status: The patient is alert and oriented x 3 at the time of the examination. The patient has apparent normal recent and remote memory, with an apparently normal attention span and concentration ability.   Speech is normal.  Cranial nerves: Extraocular movements are full. Pupils are  equal, round, and reactive to light and accomodation.  Visual fields are full.  Facial symmetry is present. There is good facial sensation to soft touch bilaterally.Facial strength is normal.  Trapezius and sternocleidomastoid strength is normal. No dysarthria is noted.  The tongue is midline, and the patient has symmetric elevation of the soft palate. No obvious hearing deficits are noted.  Motor:  Muscle bulk is normal.   Tone is normal. Strength is  5 / 5 in all 4 extremities.   Sensory: Sensory testing is intact to pinprick, soft touch and vibration sensation in all 4 extremities.  Coordination: Cerebellar testing reveals good finger-nose-finger and heel-to-shin bilaterally.  Gait and station: Station is normal.   Gait is normal. Tandem gait is normal. Romberg is negative.   Reflexes: Deep tendon reflexes are symmetric and increased in legs with spread at knees but no clonus.  .   Plantar responses are flexor.    DIAGNOSTIC DATA (LABS, IMAGING, TESTING) - I reviewed patient records, labs, notes, testing and imaging myself where available.  Lab Results  Component Value Date   WBC 6.0 06/15/2023   HGB 12.9 06/15/2023   HCT 39.1 06/15/2023   MCV 91.7 06/15/2023   PLT 231.0 06/15/2023      Component Value Date/Time   NA 137 06/15/2023 0900   K 4.1 06/15/2023 0900   CL 102 06/15/2023 0900   CO2 29 06/15/2023 0900   GLUCOSE 88 06/15/2023 0900   BUN 7 06/15/2023 0900   CREATININE 0.68 06/15/2023 0900   CREATININE 0.66 12/17/2021 1550   CALCIUM 9.3 06/15/2023 0900   PROT 6.4 06/15/2023 0900   ALBUMIN 4.0 06/15/2023 0900   AST 19 06/15/2023 0900   ALT 17 06/15/2023 0900   ALKPHOS 87 06/15/2023 0900   BILITOT 0.6 06/15/2023 0900   Lab Results  Component Value Date   CHOL 219 (H) 06/15/2023   HDL 71.60 06/15/2023   LDLCALC 127 (H) 06/15/2023   TRIG 101.0 06/15/2023   CHOLHDL 3 06/15/2023   Lab Results  Component Value Date   HGBA1C 4.8 06/15/2023   Lab Results   Component Value Date   VITAMINB12 >1501 (H) 06/15/2023   Lab Results  Component Value Date   TSH 1.33 06/15/2023       ASSESSMENT AND PLAN  Tremor  Cervicalgia - Plan: MR CERVICAL SPINE WO CONTRAST  B12 deficiency  Hyperreflexia  In summary, Theresa Fitzgerald is a 52 year old woman who has had 2 to 3 months of tremor in the hands greater than legs.  She has also noted mild weakness, hands more than legs.  On exam she has hyperreflexia.  Due to the combination of hand weakness and hyperreflexia, we need to obtain an MRI of the cervical spine to determine if there is any intrinsic or extrinsic myelopathy that could be contributing to her symptoms.  The tremor is very mild and no treatment is necessary at this time.    She will return to see me as needed based on the results of the studies.  She is also advised to call if new or worsening neurologic symptoms.  Thank you for asking me to see this patient.  Please let me know if I can be of further assistance with her or other patients in the future.    Rylei Codispoti A. Epimenio Foot, MD, [facility] 06/22/2023, 8:10 PM Certified in Neurology, Clinical Neurophysiology, -leep Medicine and Neuroimaging  [facility] [address] [place], [place] [address] [phone]

## 2023-06-23 ENCOUNTER — Other Ambulatory Visit: Payer: BC Managed Care – PPO

## 2023-06-23 ENCOUNTER — Ambulatory Visit: Payer: BC Managed Care – PPO | Admitting: Neurology

## 2023-06-29 ENCOUNTER — Ambulatory Visit (INDEPENDENT_AMBULATORY_CARE_PROVIDER_SITE_OTHER): Payer: BC Managed Care – PPO

## 2023-06-29 DIAGNOSIS — M542 Cervicalgia: Secondary | ICD-10-CM

## 2023-08-09 ENCOUNTER — Ambulatory Visit: Payer: BC Managed Care – PPO | Admitting: Neurology

## 2023-12-20 IMAGING — MG MM DIGITAL SCREENING BILAT W/ TOMO AND CAD
8 series · 9 of 24 positions shown · non-contrast
Comparison: None.

CLINICAL DATA: Screening.

EXAM:
DIGITAL SCREENING BILATERAL MAMMOGRAM WITH TOMOSYNTHESIS AND CAD
TECHNIQUE: Bilateral screening digital craniocaudal and mediolateral oblique
mammograms were obtained. Bilateral screening digital breast
tomosynthesis was performed. The images were evaluated with
computer-aided detection.

[R MLO synth-2D]
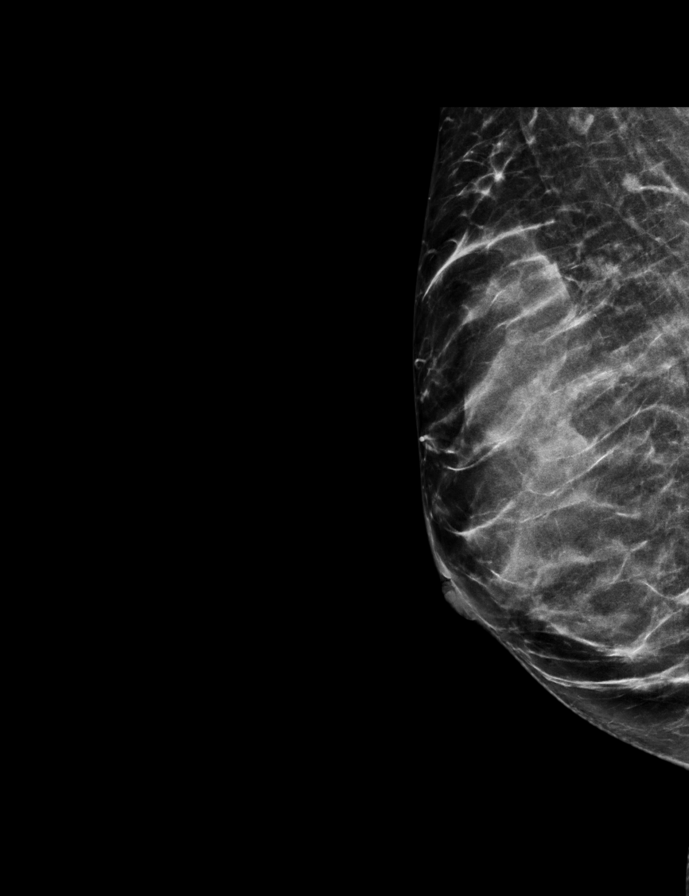

[L CC synth-2D]
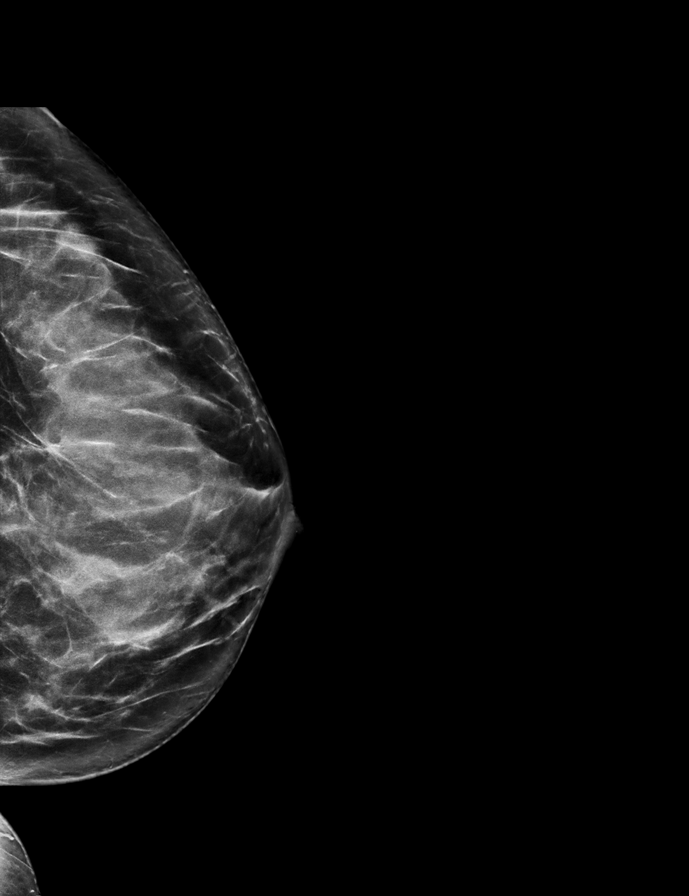

[L MLO synth-2D]
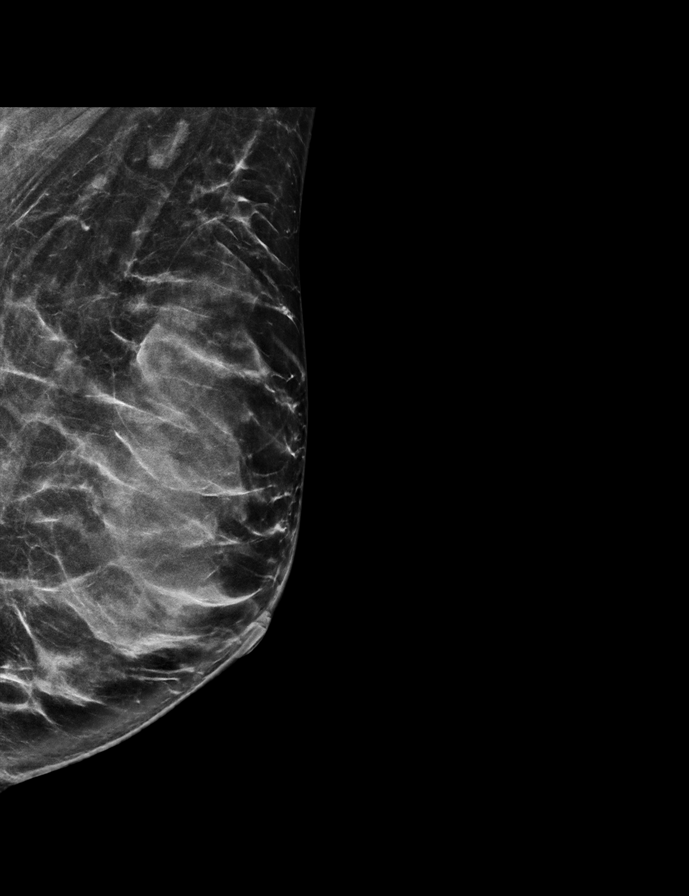

[R CC synth-2D]
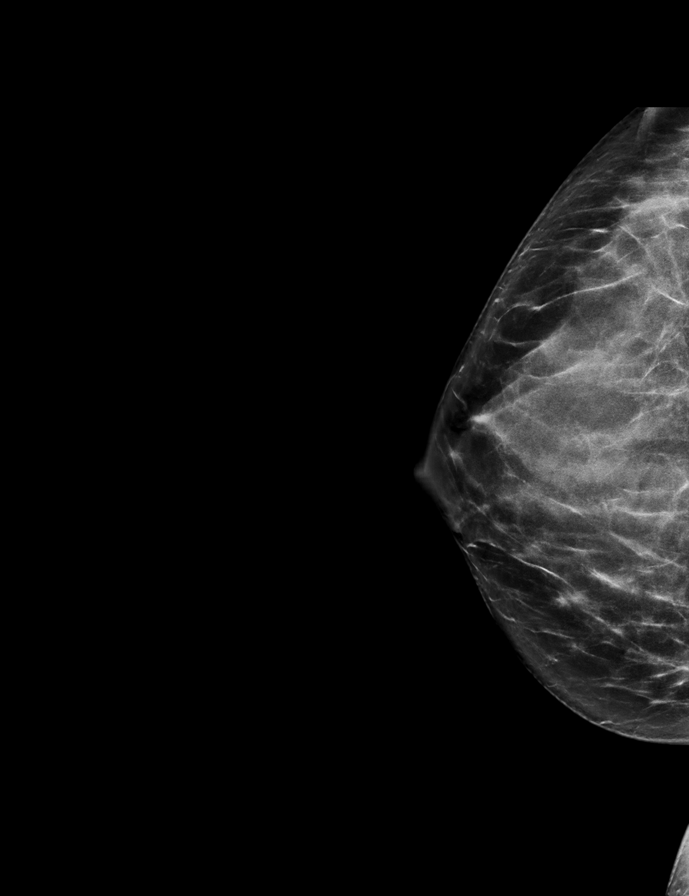

[R CC tomo · 2 of 62 frames shown]
[frame 21/62]
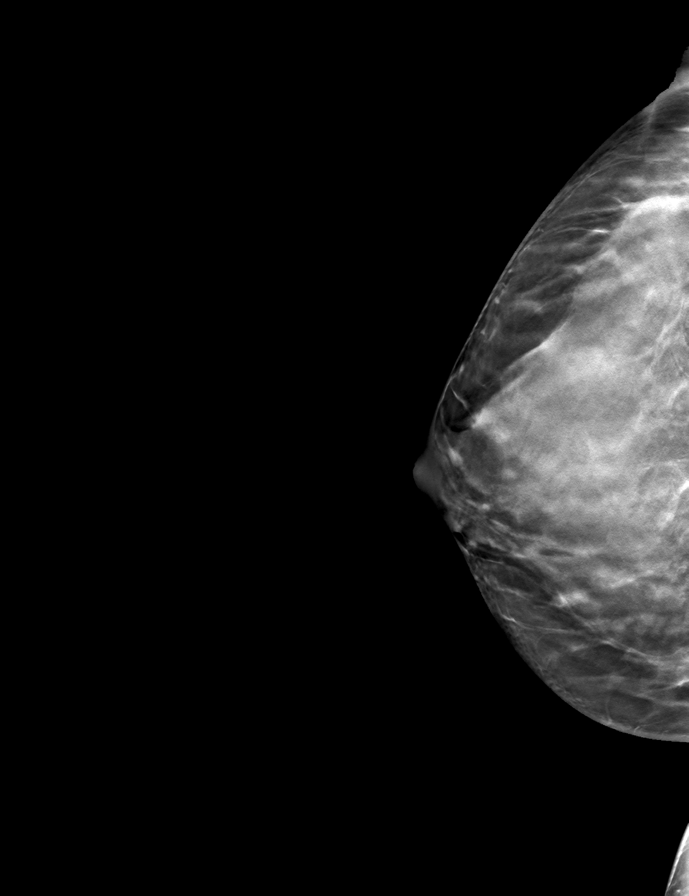
[frame 31/62]
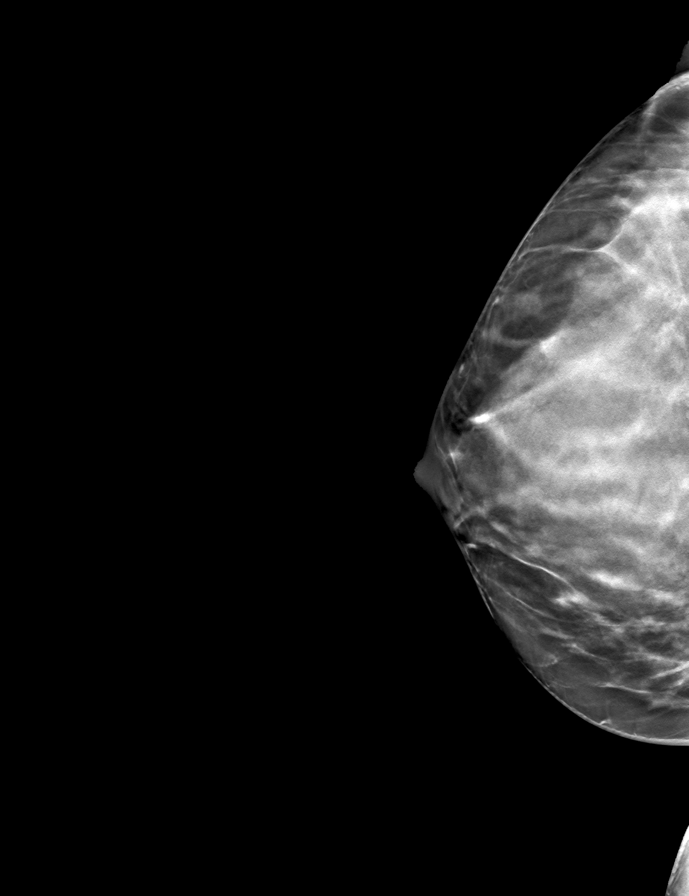

[L MLO tomo · tomo slice 32/63.0]
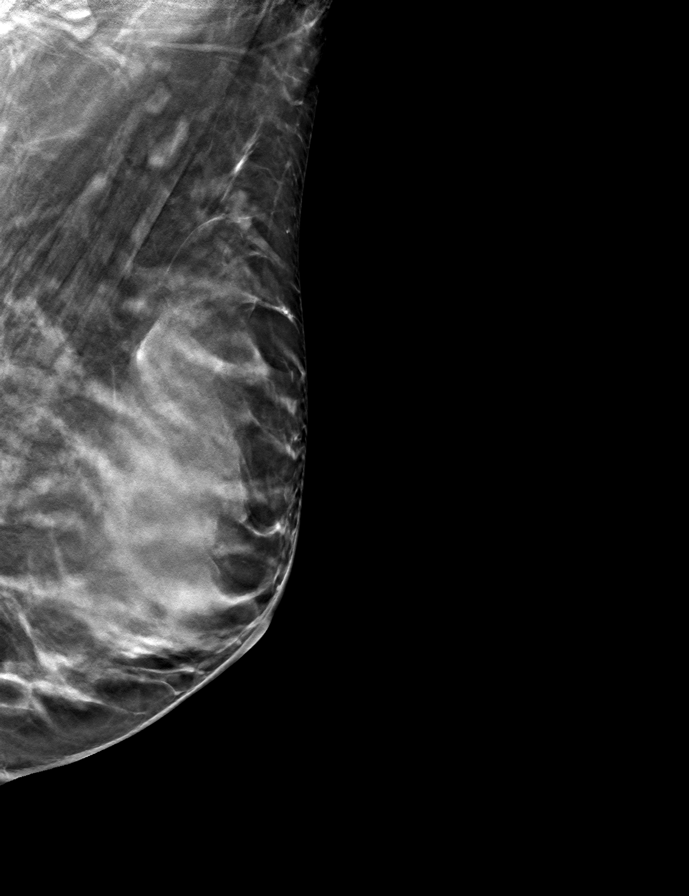

[L CC tomo · tomo slice 35/68.0]
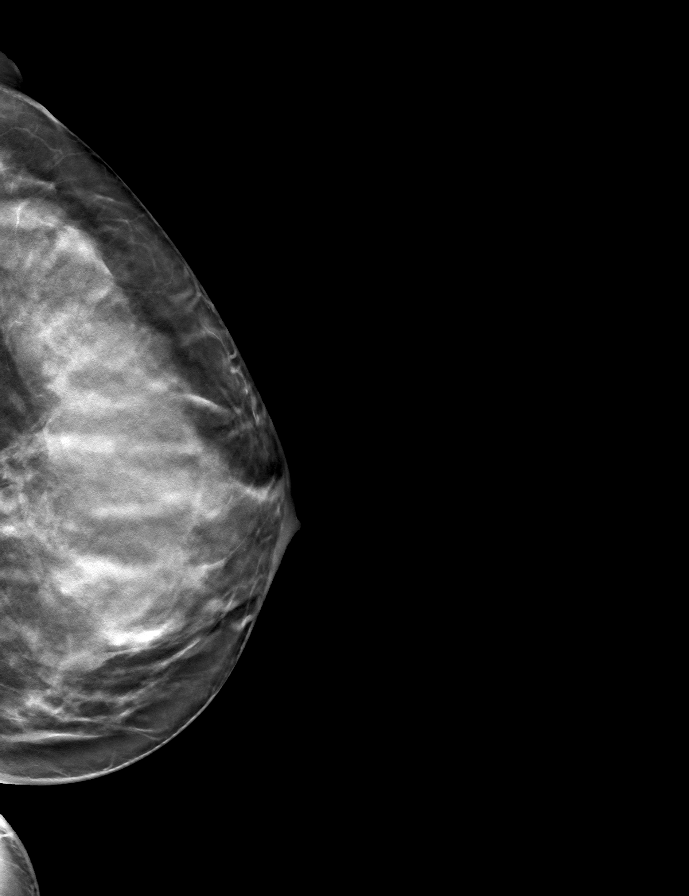

[R MLO tomo · tomo slice 28/55.0]
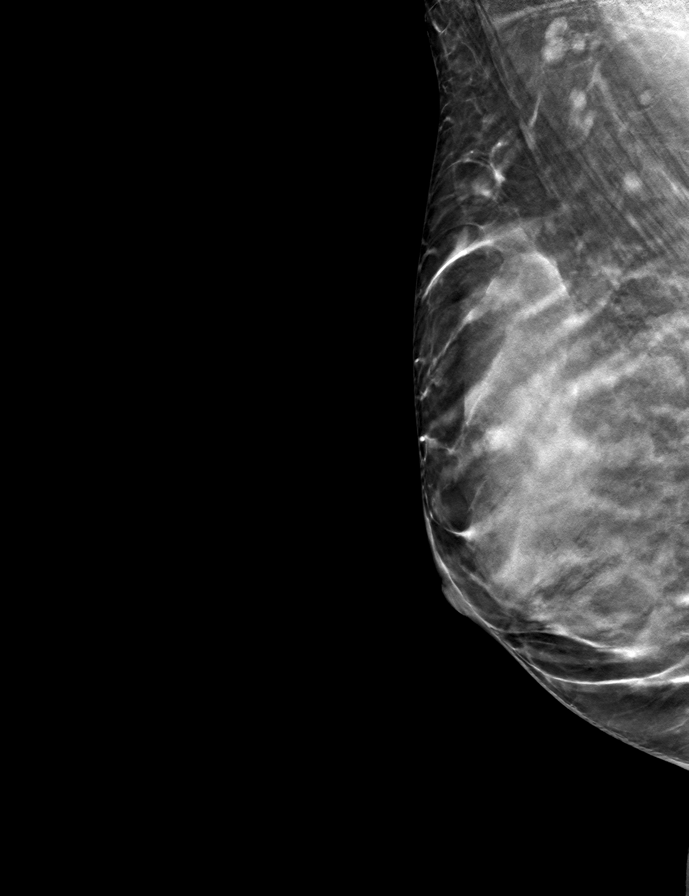

[9 of 24 positions shown; findings below may reference images not displayed]

ACR Breast Density Category d: The breast tissue is extremely dense,
which lowers the sensitivity of mammography.
FINDINGS: There are no findings suspicious for malignancy.
IMPRESSION: No mammographic evidence of malignancy. A result letter of this
screening mammogram will be mailed directly to the patient.

RECOMMENDATION:
Screening mammogram in one year. (Code:W3-Z-XIN)

BI-RADS CATEGORY  1: Negative.

## 2024-02-05 ENCOUNTER — Other Ambulatory Visit: Payer: Self-pay | Admitting: Obstetrics

## 2024-02-08 ENCOUNTER — Telehealth: Payer: Self-pay | Admitting: *Deleted

## 2024-02-08 NOTE — Telephone Encounter (Signed)
 RTC to patient regarding request for refill OCP. Pt expressed frustration in her phone message that refill was not sent when originally requested. Pt did not answer RTC. VM left with call back number. Upon investigation it was determined that a faxed request from the pharmacy for refill was not received. An RX request was made thru MyChart. Provider for which request was made was not in the office. Pt has appt in the office Thurs.02/10/24 for annual. RX refilled per protocol.

## 2024-02-08 NOTE — Progress Notes (Unsigned)
 ANNUAL EXAM Patient name: JAYDE MCALLISTER MRN [id number]  Date of birth: 23-Apr-1971 Chief Complaint:   No chief complaint on file.  History of Present Illness:   Theresa Fitzgerald is a 53 y.o. G65P1011 {race:25618} female being seen today for a routine annual exam.  Current complaints: ***  No LMP recorded.   The pregnancy intention screening data noted above was reviewed. Potential methods of contraception were discussed. The patient elected to proceed with No data recorded.  LMP: Last pap 05/08/2022- NILM, HR HPV negative. 03/01/2018- NILM, HR HPV negative. H/O abnormal pap: {yes/yes***/no:23866} Last mammogram: 06/22/2023, normal. Family h/o breast cancer: {yes***/no:23838} Last colonoscopy: ***. Results were: {normal, abnormal, n/a:23837}. Family h/o colorectal cancer: {yes***/no:23838} STI screening: Contraception?     06/15/2023    9:02 AM 04/13/2023    8:18 AM 09/28/2022    4:10 PM 05/08/2022   10:55 AM 12/17/2021    3:51 PM  Depression screen PHQ 2/9  Decreased Interest 0 0 1 0 0  Down, Depressed, Hopeless 0 0 1 0 0  PHQ - 2 Score 0 0 2 0 0  Altered sleeping   0 0 0  Tired, decreased energy   2 0 0  Change in appetite   0 0 0  Feeling bad or failure about yourself    1 0 0  Trouble concentrating   3 0 0  Moving slowly or fidgety/restless   0 0 0  Suicidal thoughts   0 0 0  PHQ-9 Score   8 0 0  Difficult doing work/chores     Not difficult at all        09/28/2022    4:10 PM 05/08/2022   10:55 AM 12/17/2021    3:52 PM  GAD 7 : Generalized Anxiety Score  Nervous, Anxious, on Edge 2 0 1  Control/stop worrying 2 0 1  Worry too much - different things 2 0 1  Trouble relaxing 2 0 1  Restless 0 0 1  Easily annoyed or irritable 2 0 1  Afraid - awful might happen 1 0 0  Total GAD 7 Score 11 0 6  Anxiety Difficulty   Somewhat difficult     Review of Systems:   Pertinent items are noted in HPI Denies any headaches, blurred vision, fatigue, shortness  of breath, chest pain, abdominal pain, abnormal vaginal discharge/itching/odor/irritation, problems with periods, bowel movements, urination, or intercourse unless otherwise stated above. Pertinent History Reviewed:  Reviewed past medical,surgical, social and family history.  Reviewed problem list, medications and allergies. Physical Assessment:  There were no vitals filed for this visit.There is no height or weight on file to calculate BMI.        Physical Examination:   General appearance - well appearing, and in no distress  Mental status - alert, oriented to person, place, and time  Psych:  She has a normal mood and affect  Skin - warm and dry, normal color, no suspicious lesions noted  Chest - effort normal, all lung fields clear to auscultation bilaterally  Heart - normal rate and regular rhythm  Neck:  midline trachea, no thyromegaly or nodules  Breasts - breasts appear normal, no suspicious masses, no skin or nipple changes or  axillary nodes  Abdomen - soft, nontender, nondistended, no masses or organomegaly  Pelvic - VULVA: normal appearing vulva with no masses, tenderness or lesions  VAGINA: normal appearing vagina with normal color and discharge, no lesions  CERVIX: normal appearing cervix without discharge or  lesions, no CMT  Thin prep pap is {Desc; done/not:10129} *** HR HPV cotesting  UTERUS: uterus is felt to be normal size, shape, consistency and nontender   ADNEXA: No adnexal masses or tenderness noted.  Extremities:  No swelling or varicosities noted  Chaperone present for exam  No results found for this or any previous visit (from the past 24 hours).  Assessment & Plan:  - Cervical cancer screening: Discussed guidelines. Pap with HPV repeat 2028.  - Breast Health: Encouraged self breast awareness/SBE. Discussed limits of clinical breast exam for detecting breast cancer. Discussed importance of annual MXR. {Blank single:19197::"Rx given for [facility] is up to date:  ***"} - Mammogram: {Mammo f/u:25212::"@ 53yo"}, or sooner if problems - Colonoscopy: {TCS f/u:25213::"@ 53yo"}, or sooner if problems - Climacteric/Sexual health: Reviewed typical and atypical symptoms of menopause/peri-menopause. Discussed PMB and to call if any amount of spotting.  - Colonoscopy: {Blank single:19197::"Per PCP","up to date","declines"} - F/U 12 months and prn   No orders of the defined types were placed in this encounter.   Meds: No orders of the defined types were placed in this encounter.   Follow-up: No follow-ups on file.  Ralene Muskrat, [place] 02/08/2024 3:26 PM

## 2024-02-10 ENCOUNTER — Ambulatory Visit: Payer: Self-pay | Admitting: Physician Assistant

## 2024-02-10 ENCOUNTER — Encounter: Payer: Self-pay | Admitting: Physician Assistant

## 2024-02-10 VITALS — BP 118/77 | HR 67 | Ht 64.0 in | Wt 132.7 lb

## 2024-02-10 DIAGNOSIS — Z01419 Encounter for gynecological examination (general) (routine) without abnormal findings: Secondary | ICD-10-CM

## 2024-02-10 MED ORDER — NORGESTIMATE-ETH ESTRADIOL 0.25-35 MG-MCG PO TABS
1.0000 | ORAL_TABLET | Freq: Every day | ORAL | 4 refills | Status: AC
Start: 2024-02-10 — End: ?

## 2024-02-10 NOTE — Progress Notes (Signed)
 Pt presents for annual. Pt declines std testing

## 2024-03-31 ENCOUNTER — Ambulatory Visit: Admitting: Family Medicine

## 2024-04-03 ENCOUNTER — Encounter: Payer: Self-pay | Admitting: Family Medicine

## 2024-04-03 ENCOUNTER — Ambulatory Visit: Admitting: Family Medicine

## 2024-04-03 VITALS — BP 116/74 | HR 89 | Temp 98.1°F | Wt 130.2 lb

## 2024-04-03 DIAGNOSIS — H9190 Unspecified hearing loss, unspecified ear: Secondary | ICD-10-CM

## 2024-04-03 DIAGNOSIS — H6993 Unspecified Eustachian tube disorder, bilateral: Secondary | ICD-10-CM | POA: Diagnosis not present

## 2024-04-03 MED ORDER — FLUTICASONE PROPIONATE 50 MCG/ACT NA SUSP: 2.0000 | Freq: Every day | NASAL | 6 refills | Status: AC

## 2024-04-03 MED ORDER — METHYLPREDNISOLONE ACETATE 80 MG/ML IJ SUSP
80.0000 mg | Freq: Once | INTRAMUSCULAR | Status: AC
Start: 2024-04-03 — End: 2024-04-03
  Administered 2024-04-03: 80 mg via INTRAMUSCULAR

## 2024-04-03 NOTE — Patient Instructions (Signed)

## 2024-04-03 NOTE — Progress Notes (Signed)
 Theresa Fitzgerald , [birth year], 53 y.o., female MRN: [id number] Patient Care Team    Relationship Specialty Notifications Start End  Mariel Shope, DO PCP - General Family Medicine  09/01/21   Gabrielle Joiner, MD Consulting Physician Obstetrics and Gynecology  09/01/21     Chief Complaint  Patient presents with   Ear Fullness    Both ears, R ear is worse.      Subjective: Theresa Fitzgerald is a 53 y.o. Pt presents for an OV with complaints of muffled hearing of a few weeks duration.  Associated symptoms include discomfort of bilateral ears right worse than left.  Her family has noticed that the TV is much louder.  Patient reports she did recently fly in an airplane and noticed discomfort became worse.  She denies any fevers or chills.  She denies any tinnitus.      04/03/2024    2:47 PM 06/15/2023    9:02 AM 04/13/2023    8:18 AM 09/28/2022    4:10 PM 05/08/2022   10:55 AM  Depression screen PHQ 2/9  Decreased Interest 0 0 0 1 0  Down, Depressed, Hopeless 0 0 0 1 0  PHQ - 2 Score 0 0 0 2 0  Altered sleeping 0   0 0  Tired, decreased energy 0   2 0  Change in appetite 0   0 0  Feeling bad or failure about yourself  0   1 0  Trouble concentrating 0   3 0  Moving slowly or fidgety/restless 0   0 0  Suicidal thoughts 0   0 0  PHQ-9 Score 0   8 0  Difficult doing work/chores Not difficult at all        No Known Allergies Social History   Social History Narrative   Marital status/children/pets: Divorced. 1 child.    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[employer]   Safety:      -smoke alarm in the home:Yes     - wears seatbelt: Yes     - Feels safe in their relationships: Yes         Right handed   Wears glasses    Drinks daily 1 cup   1 coke zero per day    Past Medical History:  Diagnosis Date   Acne    Dysmenorrhea 01/31/2016   UTI (urinary tract infection)    Vegan diet 2002   Past Surgical History:  Procedure Laterality Date    WISDOM TOOTH EXTRACTION     Family History  Problem Relation Age of Onset   Stroke Mother    Hypertension Mother    Hyperlipidemia Mother    Hearing loss Mother    Polymyalgia rheumatica Mother        Giant cell arteritis.   Allergies as of 04/03/2024   No Known Allergies      Medication List        Accurate as of Apr 03, 2024  2:49 PM. If you have any questions, ask your nurse or doctor.          cyanocobalamin  1000 MCG tablet Commonly known as: VITAMIN B12 Take 1,000 mcg by mouth daily.   norgestimate -ethinyl estradiol  0.25-35 MG-MCG tablet Commonly known as: ORTHO-CYCLEN Take 1 tablet by mouth daily.   spironolactone  50 MG tablet Commonly known as: ALDACTONE  Take 1 tablet (50 mg total) by mouth daily.        All past medical history, surgical history, allergies,  family history, immunizations andmedications were updated in the EMR today and reviewed under the history and medication portions of their EMR.     ROS Negative, with the exception of above mentioned in HPI   Objective:  BP 116/74   Pulse 89   Temp 98.1 F (36.7 C)   Wt 130 lb 3.2 oz (59.1 kg)   SpO2 98%   BMI 22.35 kg/m  Body mass index is 22.35 kg/m. Physical Exam Vitals and nursing note reviewed.  Constitutional:      General: She is not in acute distress.    Appearance: Normal appearance. She is normal weight. She is not ill-appearing or toxic-appearing.  HENT:     Head: Normocephalic and atraumatic.     Right Ear: Ear canal and external ear normal. A middle ear effusion is present. Tympanic membrane is not erythematous or bulging.     Left Ear: Ear canal and external ear normal. A middle ear effusion is present. Tympanic membrane is not erythematous or bulging.     Nose: No congestion.     Mouth/Throat:     Mouth: Mucous membranes are moist.  Eyes:     General: No scleral icterus.       Right eye: No discharge.        Left eye: No discharge.     Extraocular Movements: Extraocular  movements intact.     Conjunctiva/sclera: Conjunctivae normal.     Pupils: Pupils are equal, round, and reactive to light.  Musculoskeletal:     Cervical back: Neck supple. No tenderness.  Lymphadenopathy:     Cervical: No cervical adenopathy.  Skin:    Findings: No rash.  Neurological:     Mental Status: She is alert and oriented to person, place, and time. Mental status is at baseline.     Motor: No weakness.     Coordination: Coordination normal.     Gait: Gait normal.  Psychiatric:        Mood and Affect: Mood normal.        Behavior: Behavior normal.        Thought Content: Thought content normal.        Judgment: Judgment normal.      No results found. No results found. No results found for this or any previous visit (from the past 24 hours).  Assessment/Plan: Theresa Fitzgerald is a 54 y.o. female present for OV for  Decreased hearing, unspecified laterality/Eustachian tube dysfunction, bilateral (Primary) Start OTC antihistamine, such as Zyrtec or Xyzal nightly Start Flonase nasal spray 1 spray bilateral meals 1-2 times a day. IM Depo-Medrol injection provided today. Galbreath maneuver demonstrated.    Reviewed expectations re: course of current medical issues. Discussed self-management of symptoms. Outlined signs and symptoms indicating need for more acute intervention. Patient verbalized understanding and all questions were answered. Patient received an After-Visit Summary.    No orders of the defined types were placed in this encounter.  No orders of the defined types were placed in this encounter.  Referral Orders  No referral(s) requested today     Note is dictated utilizing voice recognition software. Although note has been proof read prior to signing, occasional typographical errors still can be missed. If any questions arise, please do not hesitate to call for verification.   electronically signed by:  Napolean Backbone, DO  [facility] [place]

## 2024-04-10 ENCOUNTER — Encounter: Payer: Self-pay | Admitting: Family Medicine

## 2024-04-10 DIAGNOSIS — H9190 Unspecified hearing loss, unspecified ear: Secondary | ICD-10-CM

## 2024-04-10 DIAGNOSIS — H6993 Unspecified Eustachian tube disorder, bilateral: Secondary | ICD-10-CM

## 2024-04-10 NOTE — Telephone Encounter (Signed)
 Please advise pt it can take a few weeks to months, before improvement is noted. She had fluid behind her ears.  We can certainly refer her to ENT for further evaluation though.   placed referral for her, since she has concerns.

## 2024-04-25 ENCOUNTER — Telehealth (INDEPENDENT_AMBULATORY_CARE_PROVIDER_SITE_OTHER): Payer: Self-pay | Admitting: Physician Assistant

## 2024-04-25 NOTE — Telephone Encounter (Signed)
 Returned Patient's call to schedule an appt. LVM to call our office.

## 2024-06-27 ENCOUNTER — Institutional Professional Consult (permissible substitution) (INDEPENDENT_AMBULATORY_CARE_PROVIDER_SITE_OTHER): Admitting: Otolaryngology

## 2024-06-27 ENCOUNTER — Ambulatory Visit (INDEPENDENT_AMBULATORY_CARE_PROVIDER_SITE_OTHER): Admitting: Audiology

## 2024-06-29 ENCOUNTER — Ambulatory Visit (INDEPENDENT_AMBULATORY_CARE_PROVIDER_SITE_OTHER): Admitting: Family Medicine

## 2024-06-29 ENCOUNTER — Encounter: Payer: Self-pay | Admitting: Family Medicine

## 2024-06-29 VITALS — BP 116/76 | HR 72 | Temp 98.2°F | Wt 127.8 lb

## 2024-06-29 DIAGNOSIS — Z0000 Encounter for general adult medical examination without abnormal findings: Secondary | ICD-10-CM

## 2024-06-29 DIAGNOSIS — Z823 Family history of stroke: Secondary | ICD-10-CM

## 2024-06-29 DIAGNOSIS — Z793 Long term (current) use of hormonal contraceptives: Secondary | ICD-10-CM | POA: Diagnosis not present

## 2024-06-29 DIAGNOSIS — Z1231 Encounter for screening mammogram for malignant neoplasm of breast: Secondary | ICD-10-CM

## 2024-06-29 DIAGNOSIS — Z1211 Encounter for screening for malignant neoplasm of colon: Secondary | ICD-10-CM

## 2024-06-29 DIAGNOSIS — L700 Acne vulgaris: Secondary | ICD-10-CM

## 2024-06-29 DIAGNOSIS — Z789 Other specified health status: Secondary | ICD-10-CM

## 2024-06-29 DIAGNOSIS — E538 Deficiency of other specified B group vitamins: Secondary | ICD-10-CM | POA: Diagnosis not present

## 2024-06-29 DIAGNOSIS — Z2821 Immunization not carried out because of patient refusal: Secondary | ICD-10-CM | POA: Diagnosis not present

## 2024-06-29 LAB — CBC
HCT: 41.3 % (ref 36.0–46.0)
Hemoglobin: 13.7 g/dL (ref 12.0–15.0)
MCHC: 33.2 g/dL (ref 30.0–36.0)
MCV: 90.3 fl (ref 78.0–100.0)
Platelets: 252 K/uL (ref 150.0–400.0)
RBC: 4.58 Mil/uL (ref 3.87–5.11)
RDW: 12.9 % (ref 11.5–15.5)
WBC: 6.3 K/uL (ref 4.0–10.5)

## 2024-06-29 LAB — COMPREHENSIVE METABOLIC PANEL WITH GFR
ALT: 18 U/L (ref 0–35)
AST: 17 U/L (ref 0–37)
Albumin: 4.1 g/dL (ref 3.5–5.2)
Alkaline Phosphatase: 76 U/L (ref 39–117)
BUN: 11 mg/dL (ref 6–23)
CO2: 32 meq/L (ref 19–32)
Calcium: 9.2 mg/dL (ref 8.4–10.5)
Chloride: 102 meq/L (ref 96–112)
Creatinine, Ser: 0.66 mg/dL (ref 0.40–1.20)
GFR: 102.14 mL/min (ref >60.00)
Glucose, Bld: 90 mg/dL (ref 70–99)
Potassium: 4.6 meq/L (ref 3.5–5.1)
Sodium: 139 meq/L (ref 135–145)
Total Bilirubin: 0.5 mg/dL (ref 0.2–1.2)
Total Protein: 6.7 g/dL (ref 6.0–8.3)

## 2024-06-29 LAB — LIPID PANEL
Cholesterol: 228 mg/dL — ABNORMAL HIGH (ref 0–200)
HDL: 65.6 mg/dL (ref >39.00)
LDL Cholesterol: 141 mg/dL — ABNORMAL HIGH (ref 0–99)
NonHDL: 162.31
Total CHOL/HDL Ratio: 3
Triglycerides: 109 mg/dL (ref 0.0–149.0)
VLDL: 21.8 mg/dL (ref 0.0–40.0)

## 2024-06-29 LAB — VITAMIN B12: Vitamin B-12: 810 pg/mL (ref 211–911)

## 2024-06-29 LAB — HEMOGLOBIN A1C: Hgb A1c MFr Bld: 5 % (ref 4.6–6.5)

## 2024-06-29 LAB — TSH: TSH: 1.72 u[IU]/mL (ref 0.35–5.50)

## 2024-06-29 MED ORDER — SPIRONOLACTONE 50 MG PO TABS: 50.0000 mg | ORAL_TABLET | Freq: Every day | ORAL | 3 refills | Status: AC

## 2024-06-29 NOTE — Patient Instructions (Addendum)

## 2024-06-29 NOTE — Progress Notes (Signed)
 Patient ID: Theresa Fitzgerald, female  DOB: [birth year], 53 y.o.   MRN: [id number] Patient Care Team    Relationship Specialty Notifications Start End  Catherine Charlies LABOR, DO PCP - General Family Medicine  09/01/21   Rudy Carlin LABOR, MD Consulting Physician Obstetrics and Gynecology  09/01/21     Chief Complaint  Patient presents with   Annual Exam    Chronic Conditions/illness Management Pt is fasting.     Subjective: Theresa Fitzgerald is a 53 y.o.  Female  present for CPE and Chronic Conditions/illness Management . All past medical history, surgical history, allergies, family history, immunizations, medications and social history were updated in the electronic medical record today. All recent labs, ED visits and hospitalizations within the last year were reviewed.  Health maintenance:  Colonoscopy: No fhx. Cologuard ordered last years without completion.cologuard ordered Mammogram: no prior. No fhx.  06/22/2023 at [facility] Completed > ordered 2025 Cervical cancer screening: last pap: 05/08/2022, (GYN- Dr. Rudy) Immunizations: tdap declined, Influenza declined (encouraged yearly), covid declined Infectious disease screening: HIV and Hep C declined DEXA:routine screen 62 Assistive device: none Oxygen ldz:[id number] Patient has a Dental home. Hospitalizations/ED visits: reviewed  Acne: Pt endorses cystic acne history. She has been tried on many face washes and gels over the years.  She has been prescribed spirolactone 50 mg qd for about 2015. She reports her acne is well-controlled on spiro medication. She had seen dermatology for this medication in the past. Her dermatologist is no longer practicing (cosmetics only).       06/29/2024    8:21 AM 04/03/2024    2:47 PM 06/15/2023    9:02 AM 04/13/2023    8:18 AM 09/28/2022    4:10 PM  Depression screen PHQ 2/9  Decreased Interest 0 0 0 0 1  Down, Depressed, Hopeless 0 0 0 0 1  PHQ - 2 Score 0 0 0 0 2  Altered  sleeping 0 0   0  Tired, decreased energy 0 0   2  Change in appetite 0 0   0  Feeling bad or failure about yourself  0 0   1  Trouble concentrating 0 0   3  Moving slowly or fidgety/restless 0 0   0  Suicidal thoughts 0 0   0  PHQ-9 Score 0 0   8  Difficult doing work/chores Not difficult at all Not difficult at all         06/29/2024    8:22 AM 04/03/2024    2:47 PM 09/28/2022    4:10 PM 05/08/2022   10:55 AM  GAD 7 : Generalized Anxiety Score  Nervous, Anxious, on Edge 0 0 2 0  Control/stop worrying 0 0 2 0  Worry too much - different things 0 0 2 0  Trouble relaxing 0 0 2 0  Restless 0 0 0 0  Easily annoyed or irritable 0 0 2 0  Afraid - awful might happen 0 0 1 0  Total GAD 7 Score 0 0 11 0  Anxiety Difficulty Not difficult at all Not difficult at all       Immunization History  Administered Date(s) Administered   PFIZER(Purple Top)SARS-COV-2 Vaccination 01/29/2020, 02/26/2020   Past Medical History:  Diagnosis Date   Acne    Anxiety 09/28/2022   Dysmenorrhea 01/31/2016   Hair loss 09/01/2021   UTI (urinary tract infection)    Vegan diet 2002   No Known Allergies Past Surgical History:  Procedure Laterality Date   WISDOM TOOTH EXTRACTION     Family History  Problem Relation Age of Onset   Stroke Mother    Hypertension Mother    Hyperlipidemia Mother    Hearing loss Mother    Polymyalgia rheumatica Mother        Giant cell arteritis.   Social History   Social History Narrative   Marital status/children/pets: Divorced. 1 child.    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 If you have any questions, ask your nurse or  doctor.          cyanocobalamin  1000 MCG tablet Commonly known as: VITAMIN B12 Take 1,000 mcg by mouth daily.   fluticasone  50 MCG/ACT nasal spray Commonly known as: FLONASE  Place 2 sprays into both nostrils daily.   norgestimate -ethinyl estradiol  0.25-35 MG-MCG tablet Commonly known as: ORTHO-CYCLEN Take 1 tablet by mouth daily.   spironolactone  50 MG tablet Commonly known as: ALDACTONE  Take 1 tablet (50 mg total) by mouth daily.        All past medical history, surgical history, allergies, family history, immunizations andmedications were updated in the EMR today and reviewed under the history and medication portions of their EMR.        ROS 14 pt review of systems performed and negative (unless mentioned in an HPI)  Objective:  BP 116/76   Pulse 72   Temp 98.2 F (36.8 C)   Wt 127 lb 12.8 oz (58 kg)   SpO2 100%   BMI 21.94 kg/m  Physical Exam Vitals and nursing note reviewed.  Constitutional:      General: She is not in acute distress.    Appearance: Normal appearance. She is not ill-appearing or toxic-appearing.  HENT:     Head: Normocephalic and atraumatic.     Right Ear: Tympanic membrane, ear canal and external ear normal. There is no impacted cerumen.     Left Ear: Tympanic membrane, ear canal and external ear normal. There is no impacted cerumen.     Nose: No congestion or rhinorrhea.     Mouth/Throat:     Mouth: Mucous membranes are moist.     Pharynx: Oropharynx is clear. No oropharyngeal exudate or posterior oropharyngeal erythema.  Eyes:     General: No scleral icterus.       Right eye: No discharge.        Left eye: No discharge.     Extraocular Movements: Extraocular movements intact.     Conjunctiva/sclera: Conjunctivae normal.     Pupils: Pupils are equal, round, and reactive to light.  Cardiovascular:     Rate and Rhythm: Normal rate and regular rhythm.     Pulses: Normal pulses.     Heart sounds: Normal heart sounds. No murmur  heard.    No friction rub. No gallop.  Pulmonary:     Effort: Pulmonary effort is normal. No respiratory distress.     Breath sounds: Normal breath sounds. No  stridor. No wheezing, rhonchi or rales.  Chest:     Chest wall: No tenderness.  Abdominal:     General: Abdomen is flat. Bowel sounds are normal. There is no distension.     Palpations: Abdomen is soft. There is no mass.     Tenderness: There is no abdominal tenderness. There is no right CVA tenderness, left CVA tenderness, guarding or rebound.     Hernia: No hernia is present.  Musculoskeletal:        General: No swelling, tenderness or deformity. Normal range of motion.     Cervical back: Normal range of motion and neck supple. No rigidity or tenderness.     Right lower leg: No edema.     Left lower leg: No edema.  Lymphadenopathy:     Cervical: No cervical adenopathy.  Skin:    General: Skin is warm and dry.     Coloration: Skin is not jaundiced or pale.     Findings: No bruising, erythema, lesion or rash.  Neurological:     General: No focal deficit present.     Mental Status: She is alert and oriented to person, place, and time. Mental status is at baseline.     Cranial Nerves: No cranial nerve deficit.     Sensory: No sensory deficit.     Motor: No weakness.     Coordination: Coordination normal.     Gait: Gait normal.     Deep Tendon Reflexes: Reflexes normal.  Psychiatric:        Mood and Affect: Mood normal.        Behavior: Behavior normal.        Thought Content: Thought content normal.        Judgment: Judgment normal.      No results found.  Assessment/plan: DARLENE BROZOWSKI is a 53 y.o. female present for CPE and Chronic Conditions/illness Management Acne vulgaris Stable Continue spiro 50 mg qd for acne.   B12 def: ~Continue Subl.  B12 daily supplement -B12 levels collected today  Routine general medical examination at a health care facility Colonoscopy: No fhx. Cologuard ordered last  years without completion.cologuard ordered Mammogram: no prior. No fhx.  06/22/2023 at [facility] Completed > ordered 2025 Cervical cancer screening: last pap: 05/08/2022, (GYN- Dr. Rudy) Immunizations: tdap declined, Influenza declined (encouraged yearly), covid declined Infectious disease screening: HIV and Hep C declined DEXA:routine screen 62 Patient was encouraged to exercise greater than 150 minutes a week. Patient was encouraged to choose a diet filled with fresh fruits and vegetables, and lean meats. AVS provided to patient today for education/recommendation on gender specific health and safety maintenance.   Return in about 1 year (around 06/30/2025) for cpe (20 min), Routine chronic condition follow-up.   Orders Placed This Encounter  Procedures   MM 3D SCREENING MAMMOGRAM BILATERAL BREAST   CBC   Comprehensive metabolic panel with GFR   Hemoglobin A1c   Lipid panel   TSH   B12   Cologuard   Meds ordered this encounter  Medications   spironolactone  (ALDACTONE ) 50 MG tablet    Sig: Take 1 tablet (50 mg total) by mouth daily.    Dispense:  90 tablet    Refill:  3   Referral Orders  No referral(s) requested today     Electronically signed by: Charlies Bellini, DO [facility] [place]

## 2024-07-03 ENCOUNTER — Ambulatory Visit: Payer: Self-pay | Admitting: Family Medicine

## 2024-07-11 ENCOUNTER — Ambulatory Visit
